# Patient Record
Sex: Female | Born: 1995 | Race: Black or African American | Hispanic: No | Marital: Single | Smoking: Never smoker
Health system: Southern US, Community
[De-identification: ages and names within clinical notes are randomized; demographics above are authoritative.]

## PROBLEM LIST (undated history)

## (undated) ENCOUNTER — Inpatient Hospital Stay (HOSPITAL_COMMUNITY): Payer: Self-pay

## (undated) DIAGNOSIS — T7840XA Allergy, unspecified, initial encounter: Secondary | ICD-10-CM

## (undated) DIAGNOSIS — L600 Ingrowing nail: Secondary | ICD-10-CM

## (undated) DIAGNOSIS — A609 Anogenital herpesviral infection, unspecified: Secondary | ICD-10-CM

## (undated) DIAGNOSIS — D649 Anemia, unspecified: Secondary | ICD-10-CM

## (undated) HISTORY — DX: Allergy, unspecified, initial encounter: T78.40XA

## (undated) HISTORY — DX: Ingrowing nail: L60.0

## (undated) HISTORY — DX: Anemia, unspecified: D64.9

---
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
## 2013-01-10 ENCOUNTER — Encounter: Payer: Self-pay | Admitting: Podiatry

## 2013-01-13 ENCOUNTER — Ambulatory Visit (INDEPENDENT_AMBULATORY_CARE_PROVIDER_SITE_OTHER): Payer: Managed Care, Other (non HMO) | Admitting: Podiatry

## 2013-01-13 ENCOUNTER — Encounter: Payer: Self-pay | Admitting: Podiatry

## 2013-01-13 VITALS — BP 105/62 | HR 63 | Ht 63.0 in | Wt 130.0 lb

## 2013-01-13 DIAGNOSIS — B351 Tinea unguium: Secondary | ICD-10-CM | POA: Insufficient documentation

## 2013-01-13 MED ORDER — CICLOPIROX 8 % EX SOLN: CUTANEOUS | Status: DC

## 2013-01-13 NOTE — Progress Notes (Signed)
Post-op visit following ingrown nail surgery Left great toe lateral border: Patient is here accompanied by her grandmother for 1 week post left hallux lateral border ingrown nail surgery. Pain is controlled without any medications. The patient denies  fever, wound drainage, increasing redness, pus, increasing pain, increasing swelling.  Stated that her mom has noted dark brown discolored right great toe. Patient and her mom want to address this problem.  Objective: Clean and dry nail border of left hallux lateral border at surgical site without surround erythema or any drainage. Right great toe has discolored brown nail at lateral 1/2. All other nails have elongated nail without changes in color or shape. Orthopedic: Rectus foot without gross deformity.  Neurologic: All epicritic and tactile sensations grossly intact.  Assessment: 1. Satisfactory post op wound healing left great toe. 2. Onychomycosis right great toe. Plan: Discussed topical treatment for the fungal infected nail. Rx. Penlac to apply affected nail.  Return in one month for follow up on nail treatment.

## 2013-01-13 NOTE — Patient Instructions (Signed)
Follow up visit after toe nail surgery on left great toe. Continue to soak left foot till pain subside. May use band aid during the day as needed. Noted right great toe has fungal infection. We will start treating it with topical medication. Apply medication(Penlac) daily and remove the top layer from the nail once a week.  Return in one month for follow up.

## 2013-02-14 ENCOUNTER — Ambulatory Visit (INDEPENDENT_AMBULATORY_CARE_PROVIDER_SITE_OTHER): Payer: Managed Care, Other (non HMO) | Admitting: Podiatry

## 2013-02-14 ENCOUNTER — Encounter: Payer: Self-pay | Admitting: Podiatry

## 2013-02-14 VITALS — Ht 63.0 in | Wt 130.0 lb

## 2013-02-14 DIAGNOSIS — B351 Tinea unguium: Secondary | ICD-10-CM

## 2013-02-14 NOTE — Progress Notes (Signed)
S: Status post nail matrixectomy left hallux, been using Penlac on right hallux x 4 weeks.  Patient was able to see improvement on right hallucal nail. O: Discolored right hallux with fungal nail.  A: Mycotic nail right hallux improving with Penlac.  P: Continue with Penlac for next 6-8 weeks.  Patient will call if she need refill on Penlac. Return as needed.

## 2013-12-29 ENCOUNTER — Ambulatory Visit (INDEPENDENT_AMBULATORY_CARE_PROVIDER_SITE_OTHER): Payer: Managed Care, Other (non HMO) | Admitting: Internal Medicine

## 2013-12-29 VITALS — BP 110/65 | HR 130 | Temp 103.0°F | Resp 18 | Ht 64.0 in | Wt 128.0 lb

## 2013-12-29 DIAGNOSIS — J020 Streptococcal pharyngitis: Secondary | ICD-10-CM

## 2013-12-29 DIAGNOSIS — R509 Fever, unspecified: Secondary | ICD-10-CM

## 2013-12-29 DIAGNOSIS — R52 Pain, unspecified: Secondary | ICD-10-CM

## 2013-12-29 DIAGNOSIS — J029 Acute pharyngitis, unspecified: Secondary | ICD-10-CM

## 2013-12-29 LAB — POCT INFLUENZA A/B
INFLUENZA A, POC: NEGATIVE
INFLUENZA B, POC: NEGATIVE

## 2013-12-29 LAB — POCT RAPID STREP A (OFFICE): Rapid Strep A Screen: POSITIVE — AB

## 2013-12-29 MED ORDER — AMOXICILLIN 875 MG PO TABS: 875.0000 mg | ORAL_TABLET | Freq: Two times a day (BID) | ORAL | Status: DC

## 2013-12-29 NOTE — Progress Notes (Signed)
This chart was scribed for Ellamae Sia, MD by Luisa Dago, ED Scribe. This patient was seen in room 14 and the patient's care was started at 9:01 PM. Subjective:    Patient ID: Tanya Pierce, female    DOB: November 10, 1995, 18 y.o.   MRN: [id number]  Chief Complaint  Patient presents with  . Fever  . Generalized Body Aches  . Sore Throat  . 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 Pt is also complaining of associated fever, generalized myalgias, and headache. She states that she went to a funeral and following it she started to experience headaches and the other associated symptoms listed above. She denies taking any OTC medication to relieve her symptoms. Denies any rhinorrhea, emesis, diarrhea, or nausea.   Pt states that she has never been diagnosed with Mono.   Patient Active Problem List   Diagnosis Date Noted  . Onychomycosis due to dermatophyte 01/13/2013   Past Medical History  Diagnosis Date  . Ingrown toenail   . Allergy   . Anemia    Past Surgical History  Procedure Laterality Date  . Eye surgery  2002    eye muscle  . Myringotomy  1998  . Adenoidectomy  1998   No Known Allergies Prior to Admission medications   Medication Sig Start Date End Date Taking? Authorizing Provider  drospirenone-ethinyl estradiol (YAZ,GIANVI,LORYNA) 3-0.02 MG tablet Take 1 tablet by mouth daily.    Historical Provider, MD   History   Social History  . Marital Status: Single    Spouse Name: N/A    Number of Children: N/A  . Years of Education: N/A   Occupational History  . Not on file.   Social History Main Topics  . Smoking status: Never Smoker   . Smokeless tobacco: Never Used  . Alcohol Use: No  . Drug Use: No  . Sexual Activity: No   Other Topics Concern  . Not on file   Social History Narrative  . No narrative on  file     Review of Systems As per HPI and PMH    Objective:   Physical Exam  Nursing note and vitals reviewed. Constitutional: She is oriented to person, place, and time. She appears well-developed and well-nourished. No distress.  HENT:  Head: Normocephalic and atraumatic.  Right Ear: External ear normal.  Left Ear: External ear normal.  Nose: Nose normal.  Mouth/Throat: Oropharyngeal exudate present.  Eyes: Conjunctivae and EOM are normal. Pupils are equal, round, and reactive to light. Right eye exhibits no discharge. Left eye exhibits no discharge.  Neck: Normal range of motion. Neck supple. No thyromegaly present.  Cardiovascular: Normal rate, regular rhythm, normal heart sounds and intact distal pulses.  Exam reveals no gallop and no friction rub.   No murmur heard. Pulmonary/Chest: Effort normal and breath sounds normal. No respiratory distress.  Abdominal: Soft. Bowel sounds are normal. She exhibits no distension and no mass. There is no tenderness. There is no rebound.  Musculoskeletal: She exhibits no edema and no tenderness.  Lymphadenopathy:       Head (right side): Tonsillar adenopathy present.       Head (left side): Tonsillar adenopathy present.  Neurological: She is alert and oriented to person, place, and time. No cranial nerve deficit.  Skin: Skin is warm and dry. No rash noted.  Psychiatric: She has a normal mood and affect. Her behavior is normal. Thought content normal.      Filed Vitals:   12/29/13 2055  BP: 110/65  Pulse: 130  Temp: 103 F (39.4 C)  TempSrc: Oral  Resp: 18  Height: 5\' 4"  (1.626 m)  Weight: 128 lb (58.06 kg)  SpO2: 98%   Results for orders placed in visit on 12/29/13  POCT INFLUENZA A/B      Result Value Ref Range   Influenza A, POC Negative     Influenza B, POC Negative    POCT RAPID STREP A (OFFICE)      Result Value Ref Range   Rapid Strep A Screen Positive (*) Negative       Assessment & Plan:  Fever, unspecified -  Plan: POCT Influenza A/B  Body aches - Plan: POCT Influenza A/B  Acute pharyngitis - Plan: POCT rapid strep A  Meds ordered this encounter  Medications  . amoxicillin (AMOXIL) 875 MG tablet    Sig: Take 1 tablet (875 mg total) by mouth 2 (two) times daily.    Dispense:  20 tablet    Refill:  0  GM [id number]    I have completed the patient encounter in its entirety as documented by the scribe, with editing by me where necessary. Robert P. Merla Richesoolittle, M.D.

## 2016-04-08 ENCOUNTER — Encounter (HOSPITAL_COMMUNITY): Payer: Self-pay | Admitting: *Deleted

## 2016-04-08 DIAGNOSIS — O26891 Other specified pregnancy related conditions, first trimester: Secondary | ICD-10-CM | POA: Diagnosis present

## 2016-04-08 DIAGNOSIS — Z3A12 12 weeks gestation of pregnancy: Secondary | ICD-10-CM | POA: Diagnosis not present

## 2016-04-08 DIAGNOSIS — R1084 Generalized abdominal pain: Secondary | ICD-10-CM | POA: Diagnosis not present

## 2016-04-08 LAB — CBC
HEMATOCRIT: 34.5 % — AB (ref 36.0–46.0)
HEMOGLOBIN: 11.4 g/dL — AB (ref 12.0–15.0)
MCH: 28.9 pg (ref 26.0–34.0)
MCHC: 33 g/dL (ref 30.0–36.0)
MCV: 87.3 fL (ref 78.0–100.0)
Platelets: 243 10*3/uL (ref 150–400)
RBC: 3.95 MIL/uL (ref 3.87–5.11)
RDW: 14.6 % (ref 11.5–15.5)
WBC: 13.4 10*3/uL — AB (ref 4.0–10.5)

## 2016-04-08 LAB — URINE MICROSCOPIC-ADD ON

## 2016-04-08 LAB — URINALYSIS, ROUTINE W REFLEX MICROSCOPIC
Bilirubin Urine: NEGATIVE
GLUCOSE, UA: NEGATIVE mg/dL
Hgb urine dipstick: NEGATIVE
KETONES UR: NEGATIVE mg/dL
Nitrite: NEGATIVE
PH: 6 (ref 5.0–8.0)
PROTEIN: NEGATIVE mg/dL
Specific Gravity, Urine: 1.015 (ref 1.005–1.030)

## 2016-04-08 LAB — I-STAT BETA HCG BLOOD, ED (MC, WL, AP ONLY)

## 2016-04-08 NOTE — ED Notes (Signed)
THE PT IS C/O abd pain after having a constipated stool   Just pta  She was dizzy while having constipated stool   lmp April  No dizziness now

## 2016-04-09 ENCOUNTER — Emergency Department (HOSPITAL_COMMUNITY)
Admission: EM | Admit: 2016-04-09 | Discharge: 2016-04-09 | Disposition: A | Discharge status: Home / Self Care | Payer: BLUE CROSS/BLUE SHIELD | Attending: Emergency Medicine | Admitting: Emergency Medicine

## 2016-04-09 DIAGNOSIS — Z3490 Encounter for supervision of normal pregnancy, unspecified, unspecified trimester: Secondary | ICD-10-CM

## 2016-04-09 LAB — COMPREHENSIVE METABOLIC PANEL
ALK PHOS: 50 U/L (ref 38–126)
ALT: 10 U/L — AB (ref 14–54)
ANION GAP: 9 (ref 5–15)
AST: 24 U/L (ref 15–41)
Albumin: 3.6 g/dL (ref 3.5–5.0)
BILIRUBIN TOTAL: 0.3 mg/dL (ref 0.3–1.2)
BUN: 9 mg/dL (ref 6–20)
CALCIUM: 9.4 mg/dL (ref 8.9–10.3)
CO2: 23 mmol/L (ref 22–32)
CREATININE: 0.64 mg/dL (ref 0.44–1.00)
Chloride: 102 mmol/L (ref 101–111)
GFR calc non Af Amer: >60 mL/min (ref >60)
GLUCOSE: 100 mg/dL — AB (ref 65–99)
Potassium: 3.8 mmol/L (ref 3.5–5.1)
SODIUM: 134 mmol/L — AB (ref 135–145)
TOTAL PROTEIN: 6.8 g/dL (ref 6.5–8.1)

## 2016-04-09 LAB — LIPASE, BLOOD: LIPASE: 33 U/L (ref 11–51)

## 2016-04-09 NOTE — ED Provider Notes (Signed)
CSN: [id number]     Arrival date & time 04/08/16  2248 History  By signing my name below, I, Tanya Pierce, attest that this documentation has been prepared under the direction and in the presence of Melene Plan, DO. Electronically Signed: Ronney Pierce, ED Scribe. 04/09/2016. 1:57 AM.    Chief Complaint  Patient presents with  . Abdominal Pain   Patient is a 20 y.o. female presenting with abdominal pain. The history is provided by the patient. No language interpreter was used.  Abdominal Pain Pain location:  Generalized Pain quality: squeezing   Pain radiates to:  Does not radiate Pain severity:  Severe (8/10) Duration:  6 hours Timing:  Intermittent Chronicity:  New Relieved by:  None tried Worsened by:  Nothing tried Ineffective treatments:  None tried Associated symptoms: diarrhea   Associated symptoms: no chills, no dysuria, no fever, no vaginal bleeding, no vaginal discharge and no vomiting     HPI Comments: Tanya Pierce is a 20 y.o. female who presents to the Emergency Department complaining of intermittent, tight, squeezing, 8/10, generalized abdominal pain that began about 6 hours ago. She reports associated diaphoresis, light-headed dizziness, and feelings of near-syncope with the pain, as well as diarrhea. Patient states she came to the ED tonight as she has never experienced these symptoms before. She reports she has a scheduled OB visit in the upcoming week. Patient states her LMP occurred 02/06/16. She denies a history of any prior pregnancies. She denies vaginal bleeding, vaginal discharge, vomiting, fever, chills, or dysuria.   Past Medical History  Diagnosis Date  . Ingrown toenail   . Allergy   . Anemia    Past Surgical History  Procedure Laterality Date  . Eye surgery  2002    eye muscle  . Myringotomy  1998  . Adenoidectomy  1998   Family History  Problem Relation Age of Onset  . Hyperlipidemia Mother    Social History  Substance Use Topics  . Smoking status:  Never Smoker   . Smokeless tobacco: Never Used  . Alcohol Use: No   OB History    No data available     Review of Systems  Constitutional: Positive for diaphoresis (accompanying pain). Negative for fever and chills.  Gastrointestinal: Positive for abdominal pain and diarrhea. Negative for vomiting.  Genitourinary: Negative for dysuria, vaginal bleeding and vaginal discharge.  Neurological: Positive for light-headedness (accompanying pain).  All other systems reviewed and are negative.     Allergies  Review of patient's allergies indicates no known allergies.  Home Medications   Prior to Admission medications   Medication Sig Start Date End Date Taking? Authorizing Provider  amoxicillin (AMOXIL) 875 MG tablet Take 1 tablet (875 mg total) by mouth 2 (two) times daily. 12/29/13   Tonye Pearson, MD  drospirenone-ethinyl estradiol Pierre Bali) 3-0.02 MG tablet Take 1 tablet by mouth daily.    Historical Provider, MD   BP 102/61 mmHg  Pulse 71  Temp(Src) 97.5 F (36.4 C) (Oral)  Resp 16  SpO2 100% Physical Exam  Constitutional: She is oriented to person, place, and time. She appears well-developed and well-nourished. No distress.  HENT:  Head: Normocephalic and atraumatic.  Eyes: Conjunctivae and EOM are normal.  Neck: Neck supple. No tracheal deviation present.  Cardiovascular: Normal rate.   Pulmonary/Chest: Effort normal. No respiratory distress.  Abdominal: There is tenderness.  Mild suprapubic tenderness.   Musculoskeletal: Normal range of motion.  Neurological: She is alert and oriented to person, place, and time.  Skin: Skin is warm and dry.  Psychiatric: She has a normal mood and affect. Her behavior is normal.  Nursing note and vitals reviewed.   ED Course  Procedures (including critical care time)  DIAGNOSTIC STUDIES: Oxygen Saturation is 100% on RA, normal by my interpretation.    COORDINATION OF CARE: 1:49 AM - Discussed treatment plan with pt  at bedside. Discussed with pt that dehydration may be contributing to her symptoms. Pt verbalized understanding and agreed to plan.   Labs Review Labs Reviewed  COMPREHENSIVE METABOLIC PANEL - Abnormal; Notable for the following:    Sodium 134 (*)    Glucose, Bld 100 (*)    ALT 10 (*)    All other components within normal limits  CBC - Abnormal; Notable for the following:    WBC 13.4 (*)    Hemoglobin 11.4 (*)    HCT 34.5 (*)    All other components within normal limits  URINALYSIS, ROUTINE W REFLEX MICROSCOPIC (NOT AT [facility]) - Abnormal; Notable for the following:    APPearance CLOUDY (*)    Leukocytes, UA SMALL (*)    All other components within normal limits  URINE MICROSCOPIC-ADD ON - Abnormal; Notable for the following:    Squamous Epithelial / LPF 6-30 (*)    Bacteria, UA MANY (*)    All other components within normal limits  I-STAT BETA HCG BLOOD, ED ([facility], [facility], [facility] ONLY) - Abnormal; Notable for the following:    I-stat hCG, quantitative >2000.0 (*)    All other components within normal limits  LIPASE, BLOOD   EMERGENCY DEPARTMENT [place] PREGNANCY "Study: Limited Ultrasound of the Pelvis"  INDICATIONS:Pregnancy(required) Multiple views of the uterus and pelvic cavity are obtained with a multi-frequency probe.  APPROACH:Transabdominal   PERFORMED BY: Myself  IMAGES ARCHIVED?: Yes  LIMITATIONS: N/A  PREGNANCY FREE FLUID: None  PREGNANCY UTERUS FINDINGS:Uterus enlarged and Gestational sac noted ADNEXAL FINDINGS: N/A  PREGNANCY FINDINGS: Intrauterine gestational sac noted, Yolk sac noted, Fetal pole present and Fetal heart activity seen  INTERPRETATION: Viable intrauterine pregnancy  GESTATIONAL AGE, ESTIMATE: 12 weeks 5 days  FETAL HEART RATE: 158  COMMENT(Estimate of Gestational Age):  12wk 5 days  I have personally reviewed and evaluated these images and lab results as part of my medical decision-making.   MDM   Final diagnoses:  None   20 yo F With a chief  complaint of crampy abdominal pain. Patient was found to be pregnant. IUP noted on outside ultrasound. Estimated 12 weeks 5 days. Patient has follow-up already scheduled with OB on Friday.    1:59 AM:  I have discussed the diagnosis/risks/treatment options with the patient and family and believe the pt to be eligible for discharge home to follow-up with OB. We also discussed returning to the ED immediately if new or worsening sx occur. We discussed the sx which are most concerning (e.g., sudden worsening pain, vaginal bleeding, vaginal discharge) that necessitate immediate return. Medications administered to the patient during their visit and any new prescriptions provided to the patient are listed below.  Medications given during this visit Medications - No data to display  New Prescriptions   No medications on file    The patient appears reasonably screen and/or stabilized for discharge and I doubt any other medical condition or other [facility] requiring further screening, evaluation, or treatment in the ED at this time prior to discharge.    I personally performed the services described in this documentation, which was scribed in my presence.  The recorded information has been reviewed and is accurate.       Melene Planan Henrik Orihuela, DO 04/09/16 [id number]

## 2016-04-09 NOTE — Discharge Instructions (Signed)
First Trimester of Pregnancy The first trimester of pregnancy is from week 1 until the end of week 12 (months 1 through 3). A week after a sperm fertilizes an egg, the egg will implant on the wall of the uterus. This embryo will begin to develop into a baby. Genes from you and your partner are forming the baby. The female genes determine whether the baby is a boy or a girl. At 6-8 weeks, the eyes and face are formed, and the heartbeat can be seen on ultrasound. At the end of 12 weeks, all the baby's organs are formed.  Now that you are pregnant, you will want to do everything you can to have a healthy baby. Two of the most important things are to get good prenatal care and to follow your health care provider's instructions. Prenatal care is all the medical care you receive before the baby's birth. This care will help prevent, find, and treat any problems during the pregnancy and childbirth. BODY CHANGES Your body goes through many changes during pregnancy. The changes vary from woman to woman.   You may gain or lose a couple of pounds at first.  You may feel sick to your stomach (nauseous) and throw up (vomit). If the vomiting is uncontrollable, call your health care provider.  You may tire easily.  You may develop headaches that can be relieved by medicines approved by your health care provider.  You may urinate more often. Painful urination may mean you have a bladder infection.  You may develop heartburn as a result of your pregnancy.  You may develop constipation because certain hormones are causing the muscles that push waste through your intestines to slow down.  You may develop hemorrhoids or swollen, bulging veins (varicose veins).  Your breasts may begin to grow larger and become tender. Your nipples may stick out more, and the tissue that surrounds them (areola) may become darker.  Your gums may bleed and may be sensitive to brushing and flossing.  Dark spots or blotches (chloasma,  mask of pregnancy) may develop on your face. This will likely fade after the baby is born.  Your menstrual periods will stop.  You may have a loss of appetite.  You may develop cravings for certain kinds of food.  You may have changes in your emotions from day to day, such as being excited to be pregnant or being concerned that something may go wrong with the pregnancy and baby.  You may have more vivid and strange dreams.  You may have changes in your hair. These can include thickening of your hair, rapid growth, and changes in texture. Some women also have hair loss during or after pregnancy, or hair that feels dry or thin. Your hair will most likely return to normal after your baby is born. WHAT TO EXPECT AT YOUR PRENATAL VISITS During a routine prenatal visit:  You will be weighed to make sure you and the baby are growing normally.  Your blood pressure will be taken.  Your abdomen will be measured to track your baby's growth.  The fetal heartbeat will be listened to starting around week 10 or 12 of your pregnancy.  Test results from any previous visits will be discussed. Your health care provider may ask you:  How you are feeling.  If you are feeling the baby move.  If you have had any abnormal symptoms, such as leaking fluid, bleeding, severe headaches, or abdominal cramping.  If you are using any tobacco products,   including cigarettes, chewing tobacco, and electronic cigarettes.  If you have any questions. Other tests that may be performed during your first trimester include:  Blood tests to find your blood type and to check for the presence of any previous infections. They will also be used to check for low iron levels (anemia) and Rh antibodies. Later in the pregnancy, blood tests for diabetes will be done along with other tests if problems develop.  Urine tests to check for infections, diabetes, or protein in the urine.  An ultrasound to confirm the proper growth  and development of the baby.  An amniocentesis to check for possible genetic problems.  Fetal screens for spina bifida and Down syndrome.  You may need other tests to make sure you and the baby are doing well.  HIV (human immunodeficiency virus) testing. Routine prenatal testing includes screening for HIV, unless you choose not to have this test. HOME CARE INSTRUCTIONS  Medicines  Follow your health care provider's instructions regarding medicine use. Specific medicines may be either safe or unsafe to take during pregnancy.  Take your prenatal vitamins as directed.  If you develop constipation, try taking a stool softener if your health care provider approves. Diet  Eat regular, well-balanced meals. Choose a variety of foods, such as meat or vegetable-based protein, fish, milk and low-fat dairy products, vegetables, fruits, and whole grain breads and cereals. Your health care provider will help you determine the amount of weight gain that is right for you.  Avoid raw meat and uncooked cheese. These carry germs that can cause birth defects in the baby.  Eating four or five small meals rather than three large meals a day may help relieve nausea and vomiting. If you start to feel nauseous, eating a few soda crackers can be helpful. Drinking liquids between meals instead of during meals also seems to help nausea and vomiting.  If you develop constipation, eat more high-fiber foods, such as fresh vegetables or fruit and whole grains. Drink enough fluids to keep your urine clear or pale yellow. Activity and Exercise  Exercise only as directed by your health care provider. Exercising will help you:  Control your weight.  Stay in shape.  Be prepared for labor and delivery.  Experiencing pain or cramping in the lower abdomen or low back is a good sign that you should stop exercising. Check with your health care provider before continuing normal exercises.  Try to avoid standing for long  periods of time. Move your legs often if you must stand in one place for a long time.  Avoid heavy lifting.  Wear low-heeled shoes, and practice good posture.  You may continue to have sex unless your health care provider directs you otherwise. Relief of Pain or Discomfort  Wear a good support bra for breast tenderness.   Take warm sitz baths to soothe any pain or discomfort caused by hemorrhoids. Use hemorrhoid cream if your health care provider approves.   Rest with your legs elevated if you have leg cramps or low back pain.  If you develop varicose veins in your legs, wear support hose. Elevate your feet for 15 minutes, 3-4 times a day. Limit salt in your diet. Prenatal Care  Schedule your prenatal visits by the twelfth week of pregnancy. They are usually scheduled monthly at first, then more often in the last 2 months before delivery.  Write down your questions. Take them to your prenatal visits.  Keep all your prenatal visits as directed by your   health care provider. Safety  Wear your seat belt at all times when driving.  Make a list of emergency phone numbers, including numbers for family, friends, the hospital, and police and fire departments. General Tips  Ask your health care provider for a referral to a local prenatal education class. Begin classes no later than at the beginning of month 6 of your pregnancy.  Ask for help if you have counseling or nutritional needs during pregnancy. Your health care provider can offer advice or refer you to specialists for help with various needs.  Do not use hot tubs, steam rooms, or saunas.  Do not douche or use tampons or scented sanitary pads.  Do not cross your legs for long periods of time.  Avoid cat litter boxes and soil used by cats. These carry germs that can cause birth defects in the baby and possibly loss of the fetus by miscarriage or stillbirth.  Avoid all smoking, herbs, alcohol, and medicines not prescribed by  your health care provider. Chemicals in these affect the formation and growth of the baby.  Do not use any tobacco products, including cigarettes, chewing tobacco, and electronic cigarettes. If you need help quitting, ask your health care provider. You may receive counseling support and other resources to help you quit.  Schedule a dentist appointment. At home, brush your teeth with a soft toothbrush and be gentle when you floss. SEEK MEDICAL CARE IF:   You have dizziness.  You have mild pelvic cramps, pelvic pressure, or nagging pain in the abdominal area.  You have persistent nausea, vomiting, or diarrhea.  You have a bad smelling vaginal discharge.  You have pain with urination.  You notice increased swelling in your face, hands, legs, or ankles. SEEK IMMEDIATE MEDICAL CARE IF:   You have a fever.  You are leaking fluid from your vagina.  You have spotting or bleeding from your vagina.  You have severe abdominal cramping or pain.  You have rapid weight gain or loss.  You vomit blood or material that looks like coffee grounds.  You are exposed to German measles and have never had them.  You are exposed to fifth disease or chickenpox.  You develop a severe headache.  You have shortness of breath.  You have any kind of trauma, such as from a fall or a car accident.   This information is not intended to replace advice given to you by your health care provider. Make sure you discuss any questions you have with your health care provider.   Document Released: 10/03/2001 Document Revised: 10/30/2014 Document Reviewed: 08/19/2013 Elsevier Interactive Patient Education 2016 [employer] Inc[employer]  

## 2016-08-29 ENCOUNTER — Inpatient Hospital Stay (HOSPITAL_COMMUNITY)
Admission: AD | Admit: 2016-08-29 | Discharge: 2016-08-29 | Disposition: A | Discharge status: Home / Self Care | Payer: BLUE CROSS/BLUE SHIELD | Source: Ambulatory Visit | Attending: Obstetrics and Gynecology | Admitting: Obstetrics and Gynecology

## 2016-08-29 ENCOUNTER — Encounter (HOSPITAL_COMMUNITY): Payer: Self-pay | Admitting: *Deleted

## 2016-08-29 ENCOUNTER — Inpatient Hospital Stay (HOSPITAL_COMMUNITY): Payer: BLUE CROSS/BLUE SHIELD

## 2016-08-29 DIAGNOSIS — O4693 Antepartum hemorrhage, unspecified, third trimester: Secondary | ICD-10-CM | POA: Insufficient documentation

## 2016-08-29 DIAGNOSIS — Z3A33 33 weeks gestation of pregnancy: Secondary | ICD-10-CM | POA: Diagnosis not present

## 2016-08-29 DIAGNOSIS — O4690 Antepartum hemorrhage, unspecified, unspecified trimester: Secondary | ICD-10-CM

## 2016-08-29 LAB — TYPE AND SCREEN
ABO/RH(D): O POS
Antibody Screen: NEGATIVE

## 2016-08-29 LAB — CBC
HCT: 35.9 % — ABNORMAL LOW (ref 36.0–46.0)
Hemoglobin: 12.5 g/dL (ref 12.0–15.0)
MCH: 30.5 pg (ref 26.0–34.0)
MCHC: 34.8 g/dL (ref 30.0–36.0)
MCV: 87.6 fL (ref 78.0–100.0)
PLATELETS: 208 10*3/uL (ref 150–400)
RBC: 4.1 MIL/uL (ref 3.87–5.11)
RDW: 14.8 % (ref 11.5–15.5)
WBC: 14.7 10*3/uL — AB (ref 4.0–10.5)

## 2016-08-29 LAB — ABO/RH: ABO/RH(D): O POS

## 2016-08-29 NOTE — MAU Note (Signed)
About 1030, used the bathroom and noticed she had started bleeding. Like a period, bright red and heavy.  Called dr.  Had intercourse last night. Went to office, was still bleeding.  Now is feeling pelvic pressure

## 2016-08-29 NOTE — MAU Note (Signed)
Urine in lab 

## 2016-08-29 NOTE — MAU Provider Note (Signed)
History     CSN: [id number]  Arrival date and time: 08/29/16 1353   None     Chief Complaint  Patient presents with  . Vaginal Bleeding   G1 @33 .5 weeks here with VB. She reports bright red vaginal bleeding as much as menstrual cycle since 10 this am. She has changed 2 pads but they were not saturated. She denies cramping, abdominal pain, and ctx but reports pelvic pressure. She reports good FM. She reports IC last night. She denies falls or trauma. She was seen in the office today and sent for further evaluation.     OB History    Gravida Para Term Preterm AB Living   1             SAB TAB Ectopic Multiple Live Births                  Past Medical History:  Diagnosis Date  . Allergy   . Anemia   . Ingrown toenail     Past Surgical History:  Procedure Laterality Date  . ADENOIDECTOMY  1998  . EYE SURGERY  2002   eye muscle  . MYRINGOTOMY  1998    Family History  Problem Relation Age of Onset  . Hyperlipidemia Mother   . Hypertension Father   . Hyperlipidemia Maternal Grandmother   . Cancer Maternal Grandfather     throat and prostate  . Diabetes Paternal Grandfather   . Heart disease Paternal Grandfather     Social History  Substance Use Topics  . Smoking status: Never Smoker  . Smokeless tobacco: Never Used  . Alcohol use No     Comment: none with preg    Allergies: No Known Allergies  Prescriptions Prior to Admission  Medication Sig Dispense Refill Last Dose  . amoxicillin (AMOXIL) 875 MG tablet Take 1 tablet (875 mg total) by mouth 2 (two) times daily. 20 tablet 0   . drospirenone-ethinyl estradiol (YAZ,GIANVI,LORYNA) 3-0.02 MG tablet Take 1 tablet by mouth daily.   Taking    Review of Systems  Constitutional: Negative.   Gastrointestinal: Negative.    Physical Exam   Blood pressure 106/65, pulse 92, temperature 98.4 F (36.9 C), temperature source Oral, resp. rate 16, weight 63.8 kg (140 lb 9.6 oz).  Physical Exam  Constitutional: She  is oriented to person, place, and time. She appears well-developed and well-nourished. No distress.  HENT:  Head: Normocephalic and atraumatic.  Neck: Normal range of motion. Neck supple.  Cardiovascular: Normal rate.   Respiratory: Effort normal.  GI: Soft. She exhibits no distension. There is no tenderness.  gravid  Genitourinary:  Genitourinary Comments: External: nno lesions Vagina: rugated, nulli, scant brown discharge SVE: closed thick   Musculoskeletal: Normal range of motion.  Neurological: She is alert and oriented to person, place, and time.  Skin: Skin is warm and dry.  Psychiatric: She has a normal mood and affect.   EFM: 145 bpm, mod variability, + accels, no decels Toco: irritability  MAU Course  Procedures  MDM Labs and US ordered. 1600-report and transfer of care given to AtticaPoe, CNM. Donette LarryMelanie Bhambri, CNM  08/29/2016 4:13 PM   Pt denies abdominal pain and has scant brown vaginal discharge Prelim US: Posterior placenta; no abruption or previa noted; AFI 13.75no abnormality noted EFM; baseline 135-140, moderate variability, reactive, no decelerations Toco: no UCs  C/W Dr. Estanislado Pandyivard> D/C home with reassurance and pelvic precautions until next scheduled PNV Assessment and Plan  G1 at 3895w5d  Vaginal bleeding in pregnancy, third trimester - Plan: Discharge patient  Vaginal bleeding in pregnancy - Plan: US MFM OB LIMITED, US MFM OB LIMITED Reassuring fetal parameters   Medication List    TAKE these medications   prenatal multivitamin Tabs tablet Take 1 tablet by mouth daily at 12 noon.      Follow-up Information    Purcell NailsOBERTS,ANGELA Y, MD Follow up.   Specialty:  Obstetrics and Gynecology Why:  Keep your scheduled PN app[ointment Contact information: [address] [place] [address] [phone]

## 2016-08-29 NOTE — Discharge Instructions (Signed)
Vaginal Bleeding During Pregnancy, Third Trimester ° A small amount of bleeding (spotting) from the vagina is common in pregnancy. Sometimes the bleeding is normal and is not a problem, and sometimes it is a sign of something serious. Be sure to tell your doctor about any bleeding from your vagina right away. °HOME CARE °· Watch your condition for any changes. °· Follow your doctor's instructions about how active you can be. °· If you are on bed rest: °¨ You may need to stay in bed and only get up to use the bathroom. °¨ You may be allowed to do some activities. °¨ If you need help, make plans for someone to help you. °· Write down: °¨ The number of pads you use each day. °¨ How often you change pads. °¨ How soaked (saturated) your pads are. °· Do not use tampons. °· Do not douche. °· Do not have sex or orgasms until your doctor says it is okay. °· Follow your doctor's advice about lifting, driving, and doing physical activities. °· If you pass any tissue from your vagina, save the tissue so you can show it to your doctor. °· Only take medicines as told by your doctor. °· Do not take aspirin because it can make you bleed. °· Keep all follow-up visits as told by your doctor. °GET HELP IF:  °· You bleed from your vagina. °· You have cramps. °· You have labor pains. °· You have a fever that does not go away after you take medicine. °GET HELP RIGHT AWAY IF: °· You have very bad cramps in your back or belly (abdomen). °· You have chills. °· You have a gush of fluid from your vagina. °· You pass large clots or tissue from your vagina. °· You bleed more. °· You feel light-headed or weak. °· You pass out (faint). °· You do not feel your baby move around as much as before. °MAKE SURE YOU: °· Understand these instructions. °· Will watch your condition. °· Will get help right away if you are not doing well or get worse. °  °This information is not intended to replace advice given to you by your health care provider. Make sure  you discuss any questions you have with your health care provider. °  °Document Released: 02/23/2014 Document Reviewed: 02/23/2014 °Elsevier Interactive Patient Education ©2016 Elsevier Inc. ° °

## 2016-09-30 ENCOUNTER — Inpatient Hospital Stay (HOSPITAL_COMMUNITY)
Admission: AD | Admit: 2016-09-30 | Discharge: 2016-09-30 | Disposition: A | Discharge status: Home / Self Care | Payer: BLUE CROSS/BLUE SHIELD | Source: Ambulatory Visit | Attending: Obstetrics and Gynecology | Admitting: Obstetrics and Gynecology

## 2016-09-30 ENCOUNTER — Encounter (HOSPITAL_COMMUNITY): Payer: Self-pay | Admitting: *Deleted

## 2016-09-30 DIAGNOSIS — A6000 Herpesviral infection of urogenital system, unspecified: Secondary | ICD-10-CM | POA: Diagnosis not present

## 2016-09-30 DIAGNOSIS — O471 False labor at or after 37 completed weeks of gestation: Secondary | ICD-10-CM | POA: Insufficient documentation

## 2016-09-30 DIAGNOSIS — O98313 Other infections with a predominantly sexual mode of transmission complicating pregnancy, third trimester: Secondary | ICD-10-CM | POA: Diagnosis not present

## 2016-09-30 DIAGNOSIS — Z3A38 38 weeks gestation of pregnancy: Secondary | ICD-10-CM | POA: Insufficient documentation

## 2016-09-30 DIAGNOSIS — Z3493 Encounter for supervision of normal pregnancy, unspecified, third trimester: Secondary | ICD-10-CM | POA: Diagnosis present

## 2016-09-30 NOTE — MAU Note (Signed)
Pt reports uc's since 0000

## 2016-09-30 NOTE — MAU Provider Note (Signed)
History    Tanya Pierce is a 20y.o. G1P0 at 38.2wks who presents, unannounced, for contractions.  Patient reports contractions started at midnight.  Patient reports good fetal movement and denies LoF and VB. Patient reports desire for epidural for pain mgmt. Patient reports that she has not had a lot to eat or drink today.  Patient Active Problem List   Diagnosis Date Noted  . Onychomycosis due to dermatophyte 01/13/2013    Chief Complaint  Patient presents with  . Contractions   HPI  OB History    Gravida Para Term Preterm AB Living   1             SAB TAB Ectopic Multiple Live Births                  Past Medical History:  Diagnosis Date  . Allergy   . Anemia   . Ingrown toenail     Past Surgical History:  Procedure Laterality Date  . ADENOIDECTOMY  1998  . EYE SURGERY  2002   eye muscle  . MYRINGOTOMY  1998    Family History  Problem Relation Age of Onset  . Hyperlipidemia Mother   . Hypertension Father   . Hyperlipidemia Maternal Grandmother   . Cancer Maternal Grandfather     throat and prostate  . Diabetes Paternal Grandfather   . Heart disease Paternal Grandfather     Social History  Substance Use Topics  . Smoking status: Never Smoker  . Smokeless tobacco: Never Used  . Alcohol use No     Comment: none with preg    Allergies:  Allergies  Allergen Reactions  . Monistat [Miconazole] Swelling and Rash    No prescriptions prior to admission.    ROS  See HPI Above Physical Exam   Blood pressure (!) 103/52, pulse 70, temperature 99.8 F (37.7 C), resp. rate 18, height 5\' 4"  (1.626 m), weight 66.3 kg (146 lb 3.2 oz).  No results found for this or any previous visit (from the past 24 hour(s)).  Physical Exam  Constitutional: She is oriented to person, place, and time. She appears well-developed and well-nourished. No distress.  HENT:  Head: Normocephalic and atraumatic.  Eyes: Conjunctivae are normal.  Neck: Normal range of motion.   Cardiovascular: Normal rate.   Respiratory: Effort normal.  GI: Soft.  Musculoskeletal: Normal range of motion.  Neurological: She is alert and oriented to person, place, and time.  Skin: Skin is warm and dry.   VE: 1-2/80/-3, firm by Clarisse GougeBridget, RN  FHR: 135 bpm, Mod Var, -Decels, +Accels UC: Q2-213min, palpates mild ED Course  Assessment: IUP at 38.2wks Cat I FT Contractions +HSV 2 Culture  Plan: -In room to discuss recent positive HSV 2 culture results -Patient appropriately upset and with many q/c -Reassurances given and q/c addressed -Informed of recommendation for primary c/s in lieu of vaginal delivery with respect to HSV diagnosis -Patient requests information remains confidential and not discussed in front of family -Oral hydration -Will monitor for 2 hours and recheck  Follow Up ([phone]) -Nurse instructed to perform vaginal exam; reports no change -Patient reports improvement in symptoms with oral hydration -Given option to receive IGM blood test for HSV-Accepts and test ordered for Igm hsv I and II stat -Will await results and if negative will consult with MD on call as appropriate -Keep appt as scheduled: 12/12 -Encouraged to call if any questions or concerns arise prior to next scheduled office visit.  -Discharged to home  in stable condition  Cherre RobinsJessica L Jase Reep CNM, MSN 09/30/2016 5:39 AM

## 2016-09-30 NOTE — Discharge Instructions (Signed)
Braxton Hicks Contractions °Contractions of the uterus can occur throughout pregnancy. Contractions are not always a sign that you are in labor.  °WHAT ARE BRAXTON HICKS CONTRACTIONS?  °Contractions that occur before labor are called Braxton Hicks contractions, or false labor. Toward the end of pregnancy (32-34 weeks), these contractions can develop more often and may become more forceful. This is not true labor because these contractions do not result in opening (dilatation) and thinning of the cervix. They are sometimes difficult to tell apart from true labor because these contractions can be forceful and people have different pain tolerances. You should not feel embarrassed if you go to the hospital with false labor. Sometimes, the only way to tell if you are in true labor is for your health care provider to look for changes in the cervix. °If there are no prenatal problems or other health problems associated with the pregnancy, it is completely safe to be sent home with false labor and await the onset of true labor. °HOW CAN YOU TELL THE DIFFERENCE BETWEEN TRUE AND FALSE LABOR? °False Labor  °· The contractions of false labor are usually shorter and not as hard as those of true labor.   °· The contractions are usually irregular.   °· The contractions are often felt in the front of the lower abdomen and in the groin.   °· The contractions may go away when you walk around or change positions while lying down.   °· The contractions get weaker and are shorter lasting as time goes on.   °· The contractions do not usually become progressively stronger, regular, and closer together as with true labor.   °True Labor  °· Contractions in true labor last 30-70 seconds, become very regular, usually become more intense, and increase in frequency.   °· The contractions do not go away with walking.   °· The discomfort is usually felt in the top of the uterus and spreads to the lower abdomen and low back.   °· True labor can be  determined by your health care provider with an exam. This will show that the cervix is dilating and getting thinner.   °WHAT TO REMEMBER °· Keep up with your usual exercises and follow other instructions given by your health care provider.   °· Take medicines as directed by your health care provider.   °· Keep your regular prenatal appointments.   °· Eat and drink lightly if you think you are going into labor.   °· If Braxton Hicks contractions are making you uncomfortable:   °¨ Change your position from lying down or resting to walking, or from walking to resting.   °¨ Sit and rest in a tub of warm water.   °¨ Drink 2-3 glasses of water. Dehydration may cause these contractions.   °¨ Do slow and deep breathing several times an hour.   °WHEN SHOULD I SEEK IMMEDIATE MEDICAL CARE? °Seek immediate medical care if: °· Your contractions become stronger, more regular, and closer together.   °· You have fluid leaking or gushing from your vagina.   °· You have a fever.     °· You have vaginal bleeding.   °· You have continuous abdominal pain.   °· You have low back pain that you never had before.   °· You feel your baby's head pushing down and causing pelvic pressure.   °· Your baby is not moving as much as it used to.   °This information is not intended to replace advice given to you by your health care provider. Make sure you discuss any questions you have with your health care provider. °Document Released: 10/09/2005 Document   Revised: 01/31/2016 Document Reviewed: 07/21/2013 °Elsevier Interactive Patient Education © 2017 Elsevier Inc. ° °

## 2016-09-30 NOTE — MAU Note (Signed)
Urine sent to lab 

## 2016-10-02 LAB — HSV(HERPES SMPLX)ABS-I+II(IGG+IGM)-BLD: HSV 1 GLYCOPROTEIN G AB, IGG: 3.84 {index} — AB (ref 0.00–0.90)

## 2016-10-05 ENCOUNTER — Ambulatory Visit (HOSPITAL_COMMUNITY)
Admission: RE | Admit: 2016-10-05 | Discharge: 2016-10-05 | Disposition: A | Discharge status: Home / Self Care | Payer: BLUE CROSS/BLUE SHIELD | Source: Ambulatory Visit | Attending: Obstetrics and Gynecology | Admitting: Obstetrics and Gynecology

## 2016-10-05 ENCOUNTER — Other Ambulatory Visit: Payer: Self-pay | Admitting: Obstetrics & Gynecology

## 2016-10-05 ENCOUNTER — Encounter (HOSPITAL_COMMUNITY): Payer: Self-pay

## 2016-10-05 DIAGNOSIS — O98313 Other infections with a predominantly sexual mode of transmission complicating pregnancy, third trimester: Secondary | ICD-10-CM | POA: Insufficient documentation

## 2016-10-05 DIAGNOSIS — O98513 Other viral diseases complicating pregnancy, third trimester: Principal | ICD-10-CM

## 2016-10-05 DIAGNOSIS — Z3A39 39 weeks gestation of pregnancy: Secondary | ICD-10-CM | POA: Insufficient documentation

## 2016-10-05 DIAGNOSIS — B009 Herpesviral infection, unspecified: Secondary | ICD-10-CM

## 2016-10-05 DIAGNOSIS — A6000 Herpesviral infection of urogenital system, unspecified: Secondary | ICD-10-CM | POA: Insufficient documentation

## 2016-10-05 NOTE — Progress Notes (Signed)
Maternal Fetal Medicine Consultation  Requesting Provider(s): Kulwa  Primary Ob: Kulwa Reason for consultation: Positive genital culture for HSV  HPI: 20yo P0 at 39+0 weeks, who was seen your office on 09/29/2016 with genital lesions suspicious for HSV. 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 Since then, the lesions have healed. She had no consitutional sympoms accompanying this outbreak. She has never had an outbreak before. She had HSV 1/2 IgM and IgG for 1 and 2 drawn. No IgM was detected, No type 2 IgG was detected, and the IgG for type 1 was positive  OB History: OB History    Gravida Para Term Preterm AB Living   1             SAB TAB Ectopic Multiple Live Births                  PMH:  Past Medical History:  Diagnosis Date  . Allergy   . Anemia   . Ingrown toenail     PSH:  Past Surgical History:  Procedure Laterality Date  . ADENOIDECTOMY  1998  . EYE SURGERY  2002   eye muscle  . MYRINGOTOMY  1998   Meds: Acyclovir, PNV Allergies: miconazole Fh: See EPIC section Soc: See EPIC section  Review of Systems: no vaginal bleeding or cramping/contractions, no LOF, no nausea/vomiting. No genital pain, burning or itching. All other systems reviewed and are negative  A/P: Recent HSV 2 outbreak  The positve culture for HSV 2 and the anibody pattern identifies this as a nonprimary first episode. This risk of neonatal transmission in these cases can be as high as 31%. For this patient with resolved lesions, the main issue is asymptomatic genital tract viral shedding which can cause neonatal transmission. Studies addressing this issue are mixed, with rates of shedding as high as 13% and as low as 0.5%. For that reason, many experts recommend cesarean delivery. Cesarean delivery reduces the risk of vertical transmission, but does not eliminate it completely. After a discussion, the patient believes she will opt for a cesarean delivery. As she is already 39  weeks, I suggested she contact your office today to discuss scheduling.  Thank you for the opportunity to be a part of the care of Levi Strausslayna Speich. Please contact our office if we can be of further assistance.   I spent approximately 30 minutes with this patient with over 50% of time spent in face-to-face counseling.

## 2016-10-06 ENCOUNTER — Inpatient Hospital Stay (HOSPITAL_COMMUNITY): Payer: BLUE CROSS/BLUE SHIELD | Admitting: Anesthesiology

## 2016-10-06 ENCOUNTER — Encounter (HOSPITAL_COMMUNITY): Payer: Self-pay | Admitting: *Deleted

## 2016-10-06 ENCOUNTER — Encounter (HOSPITAL_COMMUNITY): Payer: Self-pay

## 2016-10-06 ENCOUNTER — Other Ambulatory Visit (HOSPITAL_COMMUNITY): Payer: Self-pay

## 2016-10-06 ENCOUNTER — Inpatient Hospital Stay (HOSPITAL_COMMUNITY)
Admission: RE | Admit: 2016-10-06 | Payer: BLUE CROSS/BLUE SHIELD | Source: Ambulatory Visit | Admitting: Obstetrics & Gynecology

## 2016-10-06 ENCOUNTER — Inpatient Hospital Stay (HOSPITAL_COMMUNITY)
Admission: AD | Admit: 2016-10-06 | Discharge: 2016-10-09 | DRG: 766 | Disposition: A | Discharge status: Home / Self Care | Payer: BLUE CROSS/BLUE SHIELD | Source: Ambulatory Visit | Attending: Obstetrics & Gynecology | Admitting: Obstetrics & Gynecology

## 2016-10-06 ENCOUNTER — Encounter (HOSPITAL_COMMUNITY)
Admission: AD | Disposition: A | Discharge status: Home / Self Care | Payer: Self-pay | Source: Ambulatory Visit | Attending: Obstetrics & Gynecology

## 2016-10-06 DIAGNOSIS — B009 Herpesviral infection, unspecified: Secondary | ICD-10-CM

## 2016-10-06 DIAGNOSIS — Z3A39 39 weeks gestation of pregnancy: Secondary | ICD-10-CM

## 2016-10-06 DIAGNOSIS — A6000 Herpesviral infection of urogenital system, unspecified: Secondary | ICD-10-CM | POA: Diagnosis present

## 2016-10-06 DIAGNOSIS — O9832 Other infections with a predominantly sexual mode of transmission complicating childbirth: Secondary | ICD-10-CM | POA: Diagnosis present

## 2016-10-06 HISTORY — DX: Anogenital herpesviral infection, unspecified: A60.9

## 2016-10-06 LAB — CBC
HEMATOCRIT: 37.5 % (ref 36.0–46.0)
Hemoglobin: 13.1 g/dL (ref 12.0–15.0)
MCH: 30.3 pg (ref 26.0–34.0)
MCHC: 34.9 g/dL (ref 30.0–36.0)
MCV: 86.8 fL (ref 78.0–100.0)
Platelets: 211 10*3/uL (ref 150–400)
RBC: 4.32 MIL/uL (ref 3.87–5.11)
RDW: 14.6 % (ref 11.5–15.5)
WBC: 12.3 10*3/uL — ABNORMAL HIGH (ref 4.0–10.5)

## 2016-10-06 LAB — TYPE AND SCREEN
ABO/RH(D): O POS
ANTIBODY SCREEN: NEGATIVE

## 2016-10-06 SURGERY — Surgical Case
Anesthesia: Spinal

## 2016-10-06 MED ORDER — OXYCODONE-ACETAMINOPHEN 5-325 MG PO TABS
2.0000 | ORAL_TABLET | ORAL | Status: DC | PRN
  Administered 2016-10-07 – 2016-10-09 (×3): 2 via ORAL
  Filled 2016-10-06 (×3): qty 2

## 2016-10-06 MED ORDER — OXYTOCIN 10 UNIT/ML IJ SOLN
INTRAMUSCULAR | Status: AC
  Filled 2016-10-06: qty 4

## 2016-10-06 MED ORDER — OXYTOCIN 40 UNITS IN LACTATED RINGERS INFUSION - SIMPLE MED
INTRAVENOUS | Status: AC
  Filled 2016-10-06: qty 1000

## 2016-10-06 MED ORDER — LACTATED RINGERS IV SOLN: INTRAVENOUS | Status: DC

## 2016-10-06 MED ORDER — KETOROLAC TROMETHAMINE 30 MG/ML IJ SOLN: 30.0000 mg | Freq: Four times a day (QID) | INTRAMUSCULAR | Status: AC | PRN

## 2016-10-06 MED ORDER — PROMETHAZINE HCL 25 MG/ML IJ SOLN: 6.2500 mg | INTRAMUSCULAR | Status: DC | PRN

## 2016-10-06 MED ORDER — ACETAMINOPHEN 325 MG PO TABS
650.0000 mg | ORAL_TABLET | ORAL | Status: DC | PRN
  Administered 2016-10-07 – 2016-10-08 (×2): 650 mg via ORAL
  Filled 2016-10-06 (×2): qty 2

## 2016-10-06 MED ORDER — SODIUM CHLORIDE 0.9 % IR SOLN
Status: DC | PRN
  Administered 2016-10-06: 1000 mL

## 2016-10-06 MED ORDER — MIDAZOLAM HCL 2 MG/2ML IJ SOLN
INTRAMUSCULAR | Status: DC | PRN
  Administered 2016-10-06 (×2): 1 mg via INTRAVENOUS

## 2016-10-06 MED ORDER — NALBUPHINE HCL 10 MG/ML IJ SOLN: 5.0000 mg | Freq: Once | INTRAMUSCULAR | Status: DC | PRN

## 2016-10-06 MED ORDER — HYDROMORPHONE HCL 1 MG/ML IJ SOLN: 0.2500 mg | INTRAMUSCULAR | Status: DC | PRN

## 2016-10-06 MED ORDER — FAMOTIDINE IN NACL 20-0.9 MG/50ML-% IV SOLN
20.0000 mg | Freq: Once | INTRAVENOUS | Status: AC
  Administered 2016-10-06: 20 mg via INTRAVENOUS
  Filled 2016-10-06: qty 50

## 2016-10-06 MED ORDER — FENTANYL CITRATE (PF) 100 MCG/2ML IJ SOLN
INTRAMUSCULAR | Status: AC
  Filled 2016-10-06: qty 2

## 2016-10-06 MED ORDER — IBUPROFEN 600 MG PO TABS
600.0000 mg | ORAL_TABLET | Freq: Four times a day (QID) | ORAL | Status: DC
  Administered 2016-10-07 – 2016-10-09 (×10): 600 mg via ORAL
  Filled 2016-10-06 (×10): qty 1

## 2016-10-06 MED ORDER — WITCH HAZEL-GLYCERIN EX PADS: 1.0000 "application " | MEDICATED_PAD | CUTANEOUS | Status: DC | PRN

## 2016-10-06 MED ORDER — OXYTOCIN 10 UNIT/ML IJ SOLN
INTRAVENOUS | Status: DC | PRN
  Administered 2016-10-06: 40 [IU] via INTRAVENOUS

## 2016-10-06 MED ORDER — MORPHINE SULFATE-NACL 0.5-0.9 MG/ML-% IV SOSY
PREFILLED_SYRINGE | INTRAVENOUS | Status: AC
  Filled 2016-10-06: qty 1

## 2016-10-06 MED ORDER — DIPHENHYDRAMINE HCL 25 MG PO CAPS
25.0000 mg | ORAL_CAPSULE | Freq: Four times a day (QID) | ORAL | Status: DC | PRN
  Filled 2016-10-06: qty 1

## 2016-10-06 MED ORDER — MEPERIDINE HCL 25 MG/ML IJ SOLN
INTRAMUSCULAR | Status: AC
  Filled 2016-10-06: qty 1

## 2016-10-06 MED ORDER — ONDANSETRON HCL 4 MG/2ML IJ SOLN
INTRAMUSCULAR | Status: DC | PRN
  Administered 2016-10-06: 4 mg via INTRAVENOUS

## 2016-10-06 MED ORDER — OXYCODONE-ACETAMINOPHEN 5-325 MG PO TABS
1.0000 | ORAL_TABLET | ORAL | Status: DC | PRN
  Administered 2016-10-08 (×2): 1 via ORAL
  Filled 2016-10-06 (×3): qty 1

## 2016-10-06 MED ORDER — SOD CITRATE-CITRIC ACID 500-334 MG/5ML PO SOLN
30.0000 mL | Freq: Once | ORAL | Status: AC
  Administered 2016-10-06: 30 mL via ORAL
  Filled 2016-10-06: qty 15

## 2016-10-06 MED ORDER — DIPHENHYDRAMINE HCL 25 MG PO CAPS
25.0000 mg | ORAL_CAPSULE | ORAL | Status: DC | PRN
  Filled 2016-10-06: qty 1

## 2016-10-06 MED ORDER — COCONUT OIL OIL
1.0000 "application " | TOPICAL_OIL | Status: DC | PRN
  Administered 2016-10-08: 1 via TOPICAL
  Filled 2016-10-06 (×2): qty 120

## 2016-10-06 MED ORDER — CEFAZOLIN SODIUM-DEXTROSE 2-4 GM/100ML-% IV SOLN
2.0000 g | INTRAVENOUS | Status: DC
  Filled 2016-10-06: qty 100

## 2016-10-06 MED ORDER — NALBUPHINE HCL 10 MG/ML IJ SOLN: 5.0000 mg | INTRAMUSCULAR | Status: DC | PRN

## 2016-10-06 MED ORDER — OXYTOCIN 40 UNITS IN LACTATED RINGERS INFUSION - SIMPLE MED
2.5000 [IU]/h | INTRAVENOUS | Status: AC
  Administered 2016-10-06: 2.5 [IU]/h via INTRAVENOUS

## 2016-10-06 MED ORDER — MEPERIDINE HCL 25 MG/ML IJ SOLN
INTRAMUSCULAR | Status: DC | PRN
  Administered 2016-10-06 (×2): 12.5 mg via INTRAVENOUS

## 2016-10-06 MED ORDER — MENTHOL 3 MG MT LOZG
1.0000 | LOZENGE | OROMUCOSAL | Status: DC | PRN
  Filled 2016-10-06: qty 9

## 2016-10-06 MED ORDER — SODIUM CHLORIDE 0.9% FLUSH: 3.0000 mL | INTRAVENOUS | Status: DC | PRN

## 2016-10-06 MED ORDER — MORPHINE SULFATE-NACL 0.5-0.9 MG/ML-% IV SOSY
PREFILLED_SYRINGE | INTRAVENOUS | Status: DC | PRN
  Administered 2016-10-06: .3 mg via INTRAVENOUS

## 2016-10-06 MED ORDER — DIBUCAINE 1 % RE OINT: 1.0000 "application " | TOPICAL_OINTMENT | RECTAL | Status: DC | PRN

## 2016-10-06 MED ORDER — MEPERIDINE HCL 25 MG/ML IJ SOLN: 6.2500 mg | INTRAMUSCULAR | Status: DC | PRN

## 2016-10-06 MED ORDER — MORPHINE SULFATE (PF) 0.5 MG/ML IJ SOLN
INTRAMUSCULAR | Status: DC | PRN
  Administered 2016-10-06: .2 mg via EPIDURAL

## 2016-10-06 MED ORDER — ZOLPIDEM TARTRATE 5 MG PO TABS: 5.0000 mg | ORAL_TABLET | Freq: Every evening | ORAL | Status: DC | PRN

## 2016-10-06 MED ORDER — LACTATED RINGERS IV BOLUS (SEPSIS)
1000.0000 mL | Freq: Once | INTRAVENOUS | Status: AC
  Administered 2016-10-06: 1000 mL via INTRAVENOUS

## 2016-10-06 MED ORDER — KETOROLAC TROMETHAMINE 30 MG/ML IJ SOLN
INTRAMUSCULAR | Status: AC
  Administered 2016-10-06: 30 mg
  Filled 2016-10-06: qty 1

## 2016-10-06 MED ORDER — NALOXONE HCL 2 MG/2ML IJ SOSY
1.0000 ug/kg/h | PREFILLED_SYRINGE | INTRAMUSCULAR | Status: DC | PRN
  Filled 2016-10-06: qty 2

## 2016-10-06 MED ORDER — PRENATAL MULTIVITAMIN CH
1.0000 | ORAL_TABLET | Freq: Every day | ORAL | Status: DC
  Administered 2016-10-07 – 2016-10-09 (×3): 1 via ORAL
  Filled 2016-10-06 (×4): qty 1

## 2016-10-06 MED ORDER — DIPHENHYDRAMINE HCL 50 MG/ML IJ SOLN: 12.5000 mg | INTRAMUSCULAR | Status: DC | PRN

## 2016-10-06 MED ORDER — NALOXONE HCL 0.4 MG/ML IJ SOLN: 0.4000 mg | INTRAMUSCULAR | Status: DC | PRN

## 2016-10-06 MED ORDER — SIMETHICONE 80 MG PO CHEW
80.0000 mg | CHEWABLE_TABLET | ORAL | Status: DC | PRN
  Administered 2016-10-08: 80 mg via ORAL
  Filled 2016-10-06 (×2): qty 1

## 2016-10-06 MED ORDER — ONDANSETRON HCL 4 MG/2ML IJ SOLN
4.0000 mg | Freq: Three times a day (TID) | INTRAMUSCULAR | Status: DC | PRN
  Administered 2016-10-07: 4 mg via INTRAVENOUS
  Filled 2016-10-06: qty 2

## 2016-10-06 MED ORDER — PHENYLEPHRINE 8 MG IN D5W 100 ML (0.08MG/ML) PREMIX OPTIME
INJECTION | INTRAVENOUS | Status: DC | PRN
  Administered 2016-10-06: 60 ug/min via INTRAVENOUS

## 2016-10-06 MED ORDER — CEFAZOLIN SODIUM-DEXTROSE 2-4 GM/100ML-% IV SOLN
2.0000 g | INTRAVENOUS | Status: AC
  Administered 2016-10-06: 2 g via INTRAVENOUS

## 2016-10-06 MED ORDER — SCOPOLAMINE 1 MG/3DAYS TD PT72: 1.0000 | MEDICATED_PATCH | Freq: Once | TRANSDERMAL | Status: DC

## 2016-10-06 MED ORDER — ONDANSETRON HCL 4 MG/2ML IJ SOLN
INTRAMUSCULAR | Status: AC
  Filled 2016-10-06: qty 2

## 2016-10-06 MED ORDER — MIDAZOLAM HCL 2 MG/2ML IJ SOLN
INTRAMUSCULAR | Status: AC
Start: 2016-10-06 — End: 2016-10-06
  Filled 2016-10-06: qty 2

## 2016-10-06 MED ORDER — SENNOSIDES-DOCUSATE SODIUM 8.6-50 MG PO TABS
2.0000 | ORAL_TABLET | ORAL | Status: DC
  Administered 2016-10-07 – 2016-10-08 (×3): 2 via ORAL
  Filled 2016-10-06 (×5): qty 2

## 2016-10-06 MED ORDER — BUPIVACAINE IN DEXTROSE 0.75-8.25 % IT SOLN
INTRATHECAL | Status: DC | PRN
  Administered 2016-10-06: 1.6 mg via INTRATHECAL

## 2016-10-06 MED ORDER — LACTATED RINGERS IV SOLN
INTRAVENOUS | Status: DC
  Administered 2016-10-06 (×3): via INTRAVENOUS

## 2016-10-06 MED ORDER — FENTANYL CITRATE (PF) 100 MCG/2ML IJ SOLN
INTRAMUSCULAR | Status: DC | PRN
  Administered 2016-10-06: 90 ug via INTRAVENOUS
  Administered 2016-10-06: 10 ug via INTRAVENOUS

## 2016-10-06 SURGICAL SUPPLY — 44 items
ADH SKN CLS APL DERMABOND .7 (GAUZE/BANDAGES/DRESSINGS)
APL SKNCLS STERI-STRIP NONHPOA (GAUZE/BANDAGES/DRESSINGS) ×1
BENZOIN TINCTURE PRP APPL 2/3 (GAUZE/BANDAGES/DRESSINGS) ×2 IMPLANT
CHLORAPREP W/TINT 26ML (MISCELLANEOUS) ×3 IMPLANT
CLAMP CORD UMBIL (MISCELLANEOUS) IMPLANT
CLOSURE WOUND 1/2 X4 (GAUZE/BANDAGES/DRESSINGS) ×1
CLOTH BEACON ORANGE TIMEOUT ST (SAFETY) ×3 IMPLANT
DERMABOND ADVANCED (GAUZE/BANDAGES/DRESSINGS)
DERMABOND ADVANCED .7 DNX12 (GAUZE/BANDAGES/DRESSINGS) IMPLANT
DRAPE C SECTION CLR SCREEN (DRAPES) ×3 IMPLANT
DRSG OPSITE POSTOP 4X10 (GAUZE/BANDAGES/DRESSINGS) ×5 IMPLANT
ELECT REM PT RETURN 9FT ADLT (ELECTROSURGICAL) ×3
ELECTRODE REM PT RTRN 9FT ADLT (ELECTROSURGICAL) ×1 IMPLANT
EXTRACTOR VACUUM M CUP 4 TUBE (SUCTIONS) IMPLANT
EXTRACTOR VACUUM M CUP 4' TUBE (SUCTIONS)
GLOVE BIOGEL PI IND STRL 7.0 (GLOVE) ×2 IMPLANT
GLOVE BIOGEL PI INDICATOR 7.0 (GLOVE) ×4
GLOVE SURG SS PI 6.5 STRL IVOR (GLOVE) ×3 IMPLANT
GOWN STRL REUS W/TWL LRG LVL3 (GOWN DISPOSABLE) ×6 IMPLANT
KIT ABG SYR 3ML LUER SLIP (SYRINGE) IMPLANT
LIQUID BAND (GAUZE/BANDAGES/DRESSINGS) ×2 IMPLANT
NDL HYPO 25X1 1.5 SAFETY (NEEDLE) IMPLANT
NDL HYPO 25X5/8 SAFETYGLIDE (NEEDLE) IMPLANT
NEEDLE HYPO 25X1 1.5 SAFETY (NEEDLE) ×6 IMPLANT
NEEDLE HYPO 25X5/8 SAFETYGLIDE (NEEDLE) IMPLANT
NS IRRIG 1000ML POUR BTL (IV SOLUTION) ×3 IMPLANT
PACK C SECTION WH (CUSTOM PROCEDURE TRAY) ×3 IMPLANT
PAD OB MATERNITY 4.3X12.25 (PERSONAL CARE ITEMS) ×3 IMPLANT
PENCIL SMOKE EVAC W/HOLSTER (ELECTROSURGICAL) ×3 IMPLANT
RTRCTR C-SECT PINK 25CM LRG (MISCELLANEOUS) ×3 IMPLANT
STRIP CLOSURE SKIN 1/2X4 (GAUZE/BANDAGES/DRESSINGS) ×1 IMPLANT
SUT CHROMIC 1 CTX 36 (SUTURE) IMPLANT
SUT CHROMIC 2 0 CT 1 (SUTURE) ×3 IMPLANT
SUT MON AB 4-0 PS1 27 (SUTURE) ×3 IMPLANT
SUT PLAIN 1 NONE 54 (SUTURE) IMPLANT
SUT PLAIN 2 0 (SUTURE) ×3
SUT PLAIN 2 0 XLH (SUTURE) IMPLANT
SUT PLAIN ABS 2-0 CT1 27XMFL (SUTURE) IMPLANT
SUT VIC AB 0 CTX 36 (SUTURE) ×3
SUT VIC AB 0 CTX36XBRD ANBCTRL (SUTURE) ×1 IMPLANT
SUT VIC AB 1 CTX 36 (SUTURE) ×6
SUT VIC AB 1 CTX36XBRD ANBCTRL (SUTURE) ×2 IMPLANT
TOWEL OR 17X24 6PK STRL BLUE (TOWEL DISPOSABLE) ×3 IMPLANT
TRAY FOLEY CATH SILVER 14FR (SET/KITS/TRAYS/PACK) ×3 IMPLANT

## 2016-10-06 NOTE — H&P (Addendum)
Tanya Pierce is a 20 y.o. female presenting for Primary Ceserean Section due to primary outbreak of herpes in third trimester. Delivery Reccommnedation given by MFM.Marland Kitchen.  Pregnancy followed at [facility] since 11  weeks and remarkable for: Herpes   OB History    Gravida Para Term Preterm AB Living   1             SAB TAB Ectopic Multiple Live Births                 Past Medical History:  Diagnosis Date  . Allergy   . Anemia   . HSV (herpes simplex virus) anogenital infection   . Ingrown toenail    Past Surgical History:  Procedure Laterality Date  . ADENOIDECTOMY  1998  . EYE SURGERY  2002   eye muscle  . MYRINGOTOMY  1998    Family History:   family history includes Cancer in her maternal grandfather; Diabetes in her paternal grandfather and paternal grandmother; Heart disease in her paternal grandfather; Hyperlipidemia in her maternal grandmother and mother; Hypertension in her father. Social History:    reports that she has never smoked. She has never used smokeless tobacco. She reports that she does not drink alcohol or use drugs.   Prenatal labs: ABO, Rh: --/--/O POS, O POS (11/07 1516) Antibody: NEG (11/07 1516) Rubella: !Error!Immune RPR:   NR HBsAg:   Negative HIV:   Negative GBS:   Negative   Prenatal Transfer Tool  Maternal Diabetes: No Genetic Screening: Declined Maternal Ultrasounds/Referrals: Normal Fetal Ultrasounds or other Referrals:  None Maternal Substance Abuse:  No Significant Maternal Medications:  Meds include: Other: acyclovir 400mg  Significant Maternal Lab Results: Lab values include: Other: HSV 2     Blood pressure 109/66, pulse 77, temperature 98.3 F (36.8 C), temperature source Oral, resp. rate 16, height 5\' 4"  (1.626 m), weight 145 lb (65.8 kg), last menstrual period 01/06/2016.  General Appearance: Alert, appropriate appearance for age. No acute distress HEENT Exam: Grossly normal Chest/Respiratory Exam: Normal chest wall and  respirations. Clear to auscultation Cardiovascular Exam: Regular rate and rhythm. S1, S2, no murmur Gastrointestinal Exam: soft, non-tender, Uterus gravid with size compatible with GA, Vertex presentation by Leopold's maneuvers Psychiatric Exam: Alert and oriented, appropriate affect  ++++++++++++++++++++++++++++++++++++++++++++++++++++++++++++++++    Fetal tracings: Category 1  ++++++++++++++++++++++++++++++++++++++++++++++++++++++++++++++++   Assessment/Plan: IUP @ 39+1 Primary C/S for initial herpes outbreak in 3rd trimester     Illene BolusLori Clemmons CNM 10/06/2016, 4:25 PM   I discussed with the patient risks, benefits and alternatives of the cesarean section including risks of bleeding, infection and damage to organs. All her questions were answered and she consented to the procedure.   Dr. Sallye OberKulwa.

## 2016-10-06 NOTE — Consult Note (Signed)
[facility]  Delivery Note:  C-section       10/06/2016  6:08 PM  I was called to the operating room at the request of the patient's obstetrician (Dr. Sallye OberKulwa) for a primary c-section.  PRENATAL HX:  This is a 20 y/o G1P0 at 3339 and 1/[redacted] weeks gestation who presents for primary c-section due to recent primary HSV outbreak.  She does not have any active lesions and is on prophylactic valtrex.  Her pregnancy has been otherwise uncomplicated.   INTRAPARTUM HX:   Primary c-section with AROM at delivery.  Vacuum assisted.   DELIVERY:  Infant was vigorous at delivery, requiring no resuscitation other than standard warming, drying and stimulation.  APGARs 8 and 9.  Exam within normal limits.  After 5 minutes, baby left with nurse to assist parents with skin-to-skin care.   _____________________ Electronically Signed By: Maryan CharLindsey Syla Devoss, MD Neonatologist

## 2016-10-06 NOTE — Anesthesia Preprocedure Evaluation (Addendum)
Anesthesia Evaluation  Patient identified by MRN, date of birth, ID band Patient awake    Reviewed: Allergy & Precautions, NPO status , Patient's Chart, lab work & pertinent test results  History of Anesthesia Complications Negative for: history of anesthetic complications  Airway Mallampati: II  TM Distance: >3 FB     Dental no notable dental hx. (+) Dental Advisory Given   Pulmonary neg pulmonary ROS,    Pulmonary exam normal        Cardiovascular negative cardio ROS Normal cardiovascular exam     Neuro/Psych negative neurological ROS  negative psych ROS   GI/Hepatic negative GI ROS, Neg liver ROS,   Endo/Other  negative endocrine ROS  Renal/GU negative Renal ROS  negative genitourinary   Musculoskeletal negative musculoskeletal ROS (+)   Abdominal   Peds negative pediatric ROS (+)  Hematology negative hematology ROS (+)   Anesthesia Other Findings   Reproductive/Obstetrics (+) Pregnancy                            Anesthesia Physical Anesthesia Plan  ASA: II  Anesthesia Plan: Spinal   Post-op Pain Management:    Induction:   Airway Management Planned: Natural Airway  Additional Equipment:   Intra-op Plan:   Post-operative Plan: Extubation in OR  Informed Consent: I have reviewed the patients History and Physical, chart, labs and discussed the procedure including the risks, benefits and alternatives for the proposed anesthesia with the patient or authorized representative who has indicated his/her understanding and acceptance.   Dental advisory given  Plan Discussed with: CRNA, Anesthesiologist and Surgeon  Anesthesia Plan Comments:        Anesthesia Quick Evaluation

## 2016-10-06 NOTE — MAU Note (Signed)
Patient exposed to HSV. Sent for add-on C/S.

## 2016-10-06 NOTE — Op Note (Signed)
Patient:  Tanya Pierce, Kaiyah DOB:  06-Sep-1996 MRN:  [id number]  DATE OF SURGERY: 10/06/2016  PREOP DIAGNOSIS:  1. 39 week 1 day  EGA intrauterine pregnancy 2.  Recent initial herpes outbreak and cesarean delivery recommended by MFM to decrease neonatal perinatal infection.       POSTOP DIAGNOSIS: Same as above.  PROCEDURE: Primary low uterine segment transverse cesarean section via Pfannenstiel incision.     SURGEON: Dr.  Angus PalmsEMA Sallye OberKULWA  ASSISTANT: Illene BolusLori Clemmons, CNM  ANESTHESIA: Spinal  COMPLICATIONS: None  FINDINGS: Viable female infant in cephalic presentation, DOP, weight pending, Apgar scores of 8 and 9. Normal uterus and fallopian tubes and ovaries bilaterally.    EBL:  600 cc  IV FLUID:  2000 cc LR   URINE OUTPUT: 200 cc clear urine  INDICATIONS: 20 y/o P0 who had an initial HSV outbreak about 1 week ago, confirmed with positive HSV cultures.  She had an MFM consultation and a cesarean delivery was recommended to decrease the risk of HSV neonatal perinatal transmission.   She was consented for the procedure after explaining the risks, benefits and alternatives of the procedure.    PROCEDURE:   Informed consent was obtained from the patient to undergo the procedure. She was taken to the operating room where her spinal anesthesia was found to be adequate. She was prepped and draped in the usual sterile fashion and a Foley catheter was placed. She received 2 g of IV Ancef preoperatively.  A Pfannenstiel incision was made with the scalpel and the incision extended through the subcutaneous layer and also the fascia with the bovie. Small perforators in the subcutaneous layer were contained with the Bovie. The fascia was nicked in the midline and then was further separated from the rectus muscles bilaterally using Mayo scissors. Kochers were placed inferiorly and then superiorly to allow further separation of fascia from the rectus muscles.  The peritoneal cavity was entered bluntly with the  fingers. The Alexis retractor was placed in. The bladder flap was created using Metzenbaum scissors.   The uterus was incised with a scalpel and the incision extended bluntly bilaterally with fingers. Intact membranes were encountered, membranes ruptured and clear amniotic fluid was noted.  There was a funic presentation delivering with the head.  Head was wedged in the pelvis and was difficult to deliver with abdominal pressure and flexion.  Vacuum was then applied to the head and delivered with 1 pull.  Then the rest of the body was delivered with abdominal pressure.  She delivered a viable female infant, apgar scores 8,9.  The cord was clamped and cut. Cord blood was collected.  Baby was handed over to the pediatrics team who were aware of recent maternal HSV infection.     The uterus was not exteriorized.  Cord PH and Cord blood were collected.  The placenta was delivered with gentle traction on the umbilical cord. The edges of the uterus was grasped with Allis clamps and also T. Clamps. The uterus was cleared of clots and debris with a lap.  The incision uterine incision was closed with #1 Vicryl in a running locked stitch. An imbricating layer of same stitch was placed over the initial closure.  Irrigation was applied and suctioned out. Excellent hemostasis was noted over the incision.  The muscles were then reapproximated using chromic suture.  Fascia was closed using 0 Vicryl in a running stitch. The subcutaneous layer was irrigated and suctioned out. Small perforators were contained with the bovie.  The subcutaneous  was closed over using 1-0 plain in interrupted stitches. The skin was closed using 4-0 Monocryl. Dermabond was applied. Honeycomb was then applied. The patient was then cleaned and she was taken to the recovery room in stable condition with her neonate.    SPECIMEN: Placenta, umbilical cord blood and cord PH.   DISPOSITION: TO PACU, STABLE.

## 2016-10-06 NOTE — Anesthesia Procedure Notes (Signed)
Spinal  Patient location during procedure: OR Start time: 10/06/2016 5:34 PM End time: 10/06/2016 5:44 PM Staffing Anesthesiologist: Heather RobertsSINGER, Lemoine Goyne Performed: anesthesiologist  Preanesthetic Checklist Completed: patient identified, surgical consent, pre-op evaluation, timeout performed, IV checked, risks and benefits discussed and monitors and equipment checked Spinal Block Patient position: sitting Prep: DuraPrep Patient monitoring: cardiac monitor, continuous pulse ox and blood pressure Approach: midline Location: L2-3 Injection technique: single-shot Needle Needle type: Pencan  Needle gauge: 24 G Needle length: 9 cm Additional Notes Functioning IV was confirmed and monitors were applied. Sterile prep and drape, including hand hygiene and sterile gloves were used. The patient was positioned and the spine was prepped. The skin was anesthetized with lidocaine.  Free flow of clear CSF was obtained prior to injecting local anesthetic into the CSF.  The spinal needle aspirated freely following injection.  The needle was carefully withdrawn.  The patient tolerated the procedure well.

## 2016-10-06 NOTE — MAU Note (Signed)
Patient states she had her first out break last week and she did not take her medication.

## 2016-10-06 NOTE — Transfer of Care (Signed)
Immediate Anesthesia Transfer of Care Note  Patient: Tanya Pierce  Procedure(s) Performed: Procedure(s): CESAREAN SECTION (N/A)  Patient Location: PACU  Anesthesia Type:Spinal  Level of Consciousness: sedated  Airway & Oxygen Therapy: Patient Spontanous Breathing  Post-op Assessment: Report given to RN and Post -op Vital signs reviewed and stable  Post vital signs: Reviewed and stable  Last Vitals:  Vitals:   10/06/16 1620  BP: 109/66  Pulse: 77  Resp: 16  Temp: 36.8 C    Last Pain:  Vitals:   10/06/16 1620  TempSrc: Oral         Complications: No apparent anesthesia complications

## 2016-10-06 NOTE — Anesthesia Postprocedure Evaluation (Signed)
Anesthesia Post Note  Patient: Celedonio Miyamotolayna Kesinger  Procedure(s) Performed: Procedure(s) (LRB): CESAREAN SECTION (N/A)  Patient location during evaluation: Mother Baby Anesthesia Type: Spinal Level of consciousness: awake and alert and oriented Pain management: satisfactory to patient Vital Signs Assessment: post-procedure vital signs reviewed and stable Respiratory status: respiratory function stable and spontaneous breathing Cardiovascular status: blood pressure returned to baseline Postop Assessment: no headache, no backache, spinal receding, patient able to bend at knees and adequate PO intake Anesthetic complications: no     Last Vitals:  Vitals:   10/06/16 2043 10/06/16 2151  BP: (!) 96/50 (!) 105/49  Pulse: 67 61  Resp: 16 18  Temp: 36.6 C 36.7 C    Last Pain:  Vitals:   10/06/16 2151  TempSrc: Oral  PainSc: 5    Pain Goal: Patients Stated Pain Goal: 2 (10/06/16 2000)               Karleen DolphinFUSSELL,Katreena Schupp

## 2016-10-06 NOTE — Brief Op Note (Signed)
10/06/2016  7:10 PM  PATIENT:  Tanya MiyamotoAlayna Pierce  20 y.o. female  PRE-OPERATIVE DIAGNOSIS:  Recent Initial HSV outbreak; positive HSV2 culture  POST-OPERATIVE DIAGNOSIS:  Recent Initial HSV outbreak; positive HSV2 culture  PROCEDURE:  Procedure(s): CESAREAN SECTION (N/A)  SURGEON:  Surgeon(s) and Role:    * Hoover BrownsEma Chai Verdejo, MD - Primary  ASSISTANTS: Illene BolusLori Clemmons, CNM   ANESTHESIA:   spinal  EBL:  No intake/output data recorded.  BLOOD ADMINISTERED:none  DRAINS: none   LOCAL MEDICATIONS USED:  NONE  SPECIMEN:   Source of Specimen:  Placenta, cord PH and cord blood.  DISPOSITION OF SPECIMEN:  PATHOLOGY  COUNTS:  YES  TOURNIQUET:  * No tourniquets in log *  DICTATION: .Dragon Dictation  PLAN OF CARE: Admit to inpatient   PATIENT DISPOSITION:  PACU - hemodynamically stable.   Delay start of Pharmacological VTE agent (>24hrs) due to surgical blood loss or risk of bleeding: not applicable

## 2016-10-07 ENCOUNTER — Encounter (HOSPITAL_COMMUNITY): Payer: Self-pay | Admitting: Obstetrics and Gynecology

## 2016-10-07 LAB — CBC
HCT: 31.6 % — ABNORMAL LOW (ref 36.0–46.0)
HEMOGLOBIN: 11 g/dL — AB (ref 12.0–15.0)
MCH: 29.9 pg (ref 26.0–34.0)
MCHC: 34.8 g/dL (ref 30.0–36.0)
MCV: 85.9 fL (ref 78.0–100.0)
Platelets: 174 10*3/uL (ref 150–400)
RBC: 3.68 MIL/uL — AB (ref 3.87–5.11)
RDW: 14.7 % (ref 11.5–15.5)
WBC: 9 10*3/uL (ref 4.0–10.5)

## 2016-10-07 LAB — RPR: RPR: NONREACTIVE

## 2016-10-07 NOTE — Progress Notes (Signed)
Tanya Pierce [id number]  Subjective: Postpartum Day 1: POD 1 C/S due to primary outbreak Herpes Patient up x 1 just to stand reports no syncope or dizziness. Feeding: breastfeeding   Objective: Temp:  [97.8 F (36.6 C)-98.6 F (37 C)] 97.8 F (36.6 C) (12/16 1210) Pulse Rate:  [61-77] 64 (12/16 1210) Resp:  [10-19] 18 (12/16 1210) BP: (94-109)/(30-66) 96/56 (12/16 1210) SpO2:  [95 %-100 %] 100 % (12/16 1210) Weight:  [145 lb (65.8 kg)] 145 lb (65.8 kg) (12/15 1612)  CBC Latest Ref Rng & Units 10/07/2016 10/06/2016 08/29/2016  WBC 4.0 - 10.5 K/uL 9.0 12.3(H) 14.7(H)  Hemoglobin 12.0 - 15.0 g/dL 11.0(L) 13.1 12.5  Hematocrit 36.0 - 46.0 % 31.6(L) 37.5 35.9(L)  Platelets 150 - 400 K/uL 174 211 208     Physical Exam:  General: alert, cooperative and no distress Lochia: appropriate Uterine Fundus: firm Abdomen:  + bowel sounds, hypo Incision: Honeycomb dressing CDI DVT Evaluation: No evidence of DVT seen on physical exam.   Assessment/Plan: Status post cesarean delivery, day 1. Stable Continue current care. Breastfeeding and Circumcision prior to discharge    Rhea PinkLori A Clemmons MSN, CNM 10/07/2016, 12:37 PM

## 2016-10-07 NOTE — Lactation Note (Signed)
This note was copied from a baby's chart. Lactation Consultation Note  Patient Name: Tanya Pierce: 10/07/2016 Reason for consult: Initial assessment Baby at 22 hr of life. Mom reports bf is better today. She denies breast or nipple pain. She states after having her nipples pierced 1.5 yr ago her nipples lost sensitivity. She can feel the baby "tuggin" at the breast. Mom is having c/s related pain. She is jumpy/nervious about anything, including the baby, touching her stomach. Mom has a NS in the room but, baby latched well to both breast without it. Discussed baby behavior, feeding frequency, baby belly size, voids, wt loss, breast changes, and nipple care. Demonstrated manual expression, colostrum noted bilaterally, spoon in room. Given lactation handouts. Aware of OP services and support group.      Maternal Data Has patient been taught Hand Expression?: Yes Does the patient have breastfeeding experience prior to this delivery?: No  Feeding Feeding Type: Breast Fed Length of feed: 10 min  LATCH Score/Interventions Latch: Grasps breast easily, tongue down, lips flanged, rhythmical sucking. Intervention(s): Adjust position;Breast compression  Audible Swallowing: Spontaneous and intermittent Intervention(s): Alternate breast massage;Hand expression;Skin to skin  Type of Nipple: Flat Intervention(s): No intervention needed  Comfort (Breast/Nipple): Soft / non-tender     Hold (Positioning): Full assist, staff holds infant at breast Intervention(s): Support Pillows;Position options  LATCH Score: 7  Lactation Tools Discussed/Used     Consult Status Consult Status: Follow-up Pierce: 10/08/16 Follow-up type: In-patient    Rulon Eisenmengerlizabeth E Taichi Repka 10/07/2016, 4:17 PM

## 2016-10-07 NOTE — Anesthesia Postprocedure Evaluation (Signed)
Anesthesia Post Note  Patient: Tanya Pierce  Procedure(s) Performed: Procedure(s) (LRB): CESAREAN SECTION (N/A)  Patient location during evaluation: PACU Anesthesia Type: Spinal Level of consciousness: awake Pain management: pain level controlled Vital Signs Assessment: post-procedure vital signs reviewed and stable Respiratory status: spontaneous breathing Cardiovascular status: stable Postop Assessment: no headache, no backache, spinal receding, patient able to bend at knees and no signs of nausea or vomiting Anesthetic complications: no     Last Vitals:  Vitals:   10/06/16 2305 10/07/16 0034  BP: (!) 103/44 (!) 96/51  Pulse: 64 64  Resp: 18 16  Temp: 36.8 C 36.8 C    Last Pain:  Vitals:   10/07/16 0034  TempSrc:   PainSc: 5    Pain Goal: Patients Stated Pain Goal: 2 (10/06/16 2000)               Inas Avena JR,JOHN Susann GivensFRANKLIN

## 2016-10-08 NOTE — Progress Notes (Signed)
Saw Dr. Eddie Candleummings at nurses' station. He talked to me the possible need for a social work consultation to check in on patient, as evidenced by mother's flat affect on admission and the sister/friend of MOB pointing from behind FOB's head indicating there was an issue.

## 2016-10-08 NOTE — Progress Notes (Addendum)
Post Partum Day 2 POD 2 Subjective:  Well. Lochia are normal. Voiding, ambulating, tolerating normal diet. Has not had a bowel movement yet nursing going well.  Objective: Blood pressure (!) 112/58, pulse 64, temperature 97.7 F (36.5 C), temperature source Oral, resp. rate 20, height 5\' 4"  (1.626 m), weight 145 lb (65.8 kg), last menstrual period 01/06/2016, SpO2 100 %, unknown if currently breastfeeding.  Physical Exam:  General: normal Lochia: appropriate Uterine Fundus: @1 /U firm non-tender Honeycomb dressing dry , intact, no drainage Positive bowel sounds  Extremities: No evidence of DVT seen on physical exam. Edema none     Recent Labs  10/06/16 1636 10/07/16 0521  HGB 13.1 11.0*  HCT 37.5 31.6*    Assessment/Plan: Normal Post-partum. Continue routine post-partum care. Anticipate discharge tomorrow Circumcision tomorrow    LOS: 2 days   Rhea PinkLori A Charls Custer MD 10/08/2016, 9:49 AM

## 2016-10-08 NOTE — Progress Notes (Signed)
CSW received consult to complete psychosocial assessment.  CSW attempted to meet with MOB, but she had numerous visitors at this time and requested that CSW return in the am.  CSW will attempt again tomorrow.   

## 2016-10-08 NOTE — Lactation Note (Signed)
This note was copied from a baby's chart. Lactation Consultation Note  Patient Name: Tanya Pierce NWGNF'AToday's Date: 10/08/2016 Reason for consult: Follow-up assessment Baby at 52 hr of life. Upon entry baby was sleeping sts with mom. Mom is worried because baby has been cluster feeding. She denies breast or nipple pain. Discussed baby behavior, feeding frequency, baby belly size, voids, wt loss, breast changes, and nipple care. Discussed manually expressing/spoon feeding and keeping baby awake at the breast. She is aware of lactation services and support group. She will call as needed.    Maternal Data    Feeding Feeding Type: Breast Fed Length of feed: 15 min  LATCH Score/Interventions Latch: Grasps breast easily, tongue down, lips flanged, rhythmical sucking.  Audible Swallowing: A few with stimulation  Type of Nipple: Everted at rest and after stimulation  Comfort (Breast/Nipple): Soft / non-tender     Hold (Positioning): Assistance needed to correctly position infant at breast and maintain latch.  LATCH Score: 8  Lactation Tools Discussed/Used     Consult Status Consult Status: Follow-up Date: 10/09/16 Follow-up type: In-patient    Rulon Eisenmengerlizabeth E Maelynn Moroney 10/08/2016, 11:00 PM

## 2016-10-08 NOTE — Progress Notes (Signed)
When I had the chance to speak with pt privately, I asked her about how she was feeling emotionally, as myself and other providers had noticed she seemed flat/sad.   She admitted feeling sad, explaining that while she understood that FOB wanted to be a part of his child's life and she was alright with him being present, they were not getting along personally and she felt like he was being dishonest with her. She denied needing anything from the nursing staff nor did she want FOB to not be permitted from the room. I offered emotional support and reassured her that we were here to help her.

## 2016-10-09 MED ORDER — OXYCODONE-ACETAMINOPHEN 5-325 MG PO TABS: 1.0000 | ORAL_TABLET | Freq: Four times a day (QID) | ORAL | 0 refills | Status: DC | PRN

## 2016-10-09 MED ORDER — FAMOTIDINE 20 MG PO TABS
20.0000 mg | ORAL_TABLET | Freq: Once | ORAL | Status: AC
  Administered 2016-10-09: 20 mg via ORAL
  Filled 2016-10-09: qty 1

## 2016-10-09 MED ORDER — SENNOSIDES-DOCUSATE SODIUM 8.6-50 MG PO TABS: 2.0000 | ORAL_TABLET | ORAL | 1 refills | Status: DC

## 2016-10-09 MED ORDER — IBUPROFEN 600 MG PO TABS: 600.0000 mg | ORAL_TABLET | Freq: Four times a day (QID) | ORAL | 1 refills | Status: DC

## 2016-10-09 NOTE — Discharge Instructions (Signed)
Etonogestrel implant °What is this medicine? °ETONOGESTREL (et oh noe JES trel) is a contraceptive (birth control) device. It is used to prevent pregnancy. It can be used for up to 3 years. °COMMON BRAND NAME(S): Implanon, Nexplanon °What should I tell my health care provider before I take this medicine? °They need to know if you have any of these conditions: °-abnormal vaginal bleeding °-blood vessel disease or blood clots °-cancer of the breast, cervix, or liver °-depression °-diabetes °-gallbladder disease °-headaches °-heart disease or recent heart attack °-high blood pressure °-high cholesterol °-kidney disease °-liver disease °-renal disease °-seizures °-tobacco smoker °-an unusual or allergic reaction to etonogestrel, other hormones, anesthetics or antiseptics, medicines, foods, dyes, or preservatives °-pregnant or trying to get pregnant °-breast-feeding °How should I use this medicine? °This device is inserted just under the skin on the inner side of your upper arm by a health care professional. °Talk to your pediatrician regarding the use of this medicine in children. Special care may be needed. °What if I miss a dose? °This does not apply. °What may interact with this medicine? °Do not take this medicine with any of the following medications: °-amprenavir °-bosentan °-fosamprenavir °This medicine may also interact with the following medications: °-barbiturate medicines for inducing sleep or treating seizures °-certain medicines for fungal infections like ketoconazole and itraconazole °-griseofulvin °-medicines to treat seizures like carbamazepine, felbamate, oxcarbazepine, phenytoin, topiramate °-modafinil °-phenylbutazone °-rifampin °-some medicines to treat HIV infection like atazanavir, indinavir, lopinavir, nelfinavir, tipranavir, ritonavir °-St. John's wort °What should I watch for while using this medicine? °This product does not protect you against HIV infection (AIDS) or other sexually transmitted  diseases. °You should be able to feel the implant by pressing your fingertips over the skin where it was inserted. Contact your doctor if you cannot feel the implant, and use a non-hormonal birth control method (such as condoms) until your doctor confirms that the implant is in place. If you feel that the implant may have broken or become bent while in your arm, contact your healthcare provider. °What side effects may I notice from receiving this medicine? °Side effects that you should report to your doctor or health care professional as soon as possible: °-allergic reactions like skin rash, itching or hives, swelling of the face, lips, or tongue °-breast lumps °-changes in emotions or moods °-depressed mood °-heavy or prolonged menstrual bleeding °-pain, irritation, swelling, or bruising at the insertion site °-scar at site of insertion °-signs of infection at the insertion site such as fever, and skin redness, pain or discharge °-signs of pregnancy °-signs and symptoms of a blood clot such as breathing problems; changes in vision; chest pain; severe, sudden headache; pain, swelling, warmth in the leg; trouble speaking; sudden numbness or weakness of the face, arm or leg °-signs and symptoms of liver injury like dark yellow or brown urine; general ill feeling or flu-like symptoms; light-colored stools; loss of appetite; nausea; right upper belly pain; unusually weak or tired; yellowing of the eyes or skin °-unusual vaginal bleeding, discharge °-signs and symptoms of a stroke like changes in vision; confusion; trouble speaking or understanding; severe headaches; sudden numbness or weakness of the face, arm or leg; trouble walking; dizziness; loss of balance or coordination °Side effects that usually do not require medical attention (report to your doctor or health care professional if they continue or are bothersome): °-acne °-back pain °-breast pain °-changes in weight °-dizziness °-general ill feeling or flu-like  symptoms °-headache °-irregular menstrual bleeding °-nausea °-sore throat °-vaginal   irritation or inflammation Where should I keep my medicine? This drug is given in a hospital or clinic and will not be stored at home.  2017 Elsevier/Gold Standard (2015-11-11 10:56:20) Intrauterine Device Information Introduction An intrauterine device (IUD) is a medical device that gets inserted into the uterus to prevent pregnancy. It is a small, T-shaped device that has one or two nylon strings hanging down from it. The strings hang out of the lower part of the uterus (cervix) to allow for future IUD removal. There are two types of IUDs available:  Copper IUD. This type of IUD has copper wire wrapped around it. A copper IUD may last up to 10 years.  Hormone IUD. This type of IUD is made of plastic and contains the hormone progestin (synthetic progesterone). A hormone IUD may last 3 to 5 years. IUDs are inserted through the vagina and placed into the uterus with a minor medical procedure. How does the IUD work? Copper in the copper IUD prevents pregnancy by making the uterus and fallopian tubes produce a fluid that kills sperm. Synthetic progesterone in hormonal IUD prevents pregnancy by:  Thickening cervical mucus to prevent sperm from entering the uterus.  Thinning the uterine lining to prevent implantation of a fertilized egg.  Weakening or killing sperm that get into the uterus. What are the advantages of an IUD?  IUDs are highly effective, reversible, long-acting, and low-maintenance.  There are no estrogen-related side effects.  An IUD can be used when breastfeeding.  IUDs are not associated with weight gain.  Advantages of the copper IUD are that:  It works immediately after insertion.  It does not interfere with your body's natural hormones.  It can be used for 10 years.  Advantages of the hormone IUD are that:  If it is inserted within 7 days of your period starting, it works  immediately after insertion. If the hormone IUD is inserted at any other time in your cycle, you will need to use a backup method of birth control for 7 days after insertion.  It can make menstrual periods lighter.  It can decrease menstrual cramping.  It can be used for 3 or 5 years. What are the disadvantages of an IUD?  The hormone IUD may cause irregular menstrual bleeding for a period of time after insertion.  The copper IUD can make your menstrual flow heavier and more painful.  You may experience some pain during insertion, and cramping and vaginal bleeding after insertion. How is the IUD removed? Is the IUD right for me?  Your health care provider will make sure you are a good candidate for an IUD and will discuss side effects with you. This information is not intended to replace advice given to you by your health care provider. Make sure you discuss any questions you have with your health care provider. Document Released: 09/12/2004 Document Revised: 03/16/2016 Document Reviewed: 03/30/2013  2017 Elsevier Postpartum Depression and Baby Blues The postpartum period begins right after the birth of a baby. During this time, there is often a great amount of joy and excitement. It is also a time of many changes in the life of the parents. Regardless of how many times a mother gives birth, each child brings new challenges and dynamics to the family. It is not unusual to have feelings of excitement along with confusing shifts in moods, emotions, and thoughts. All mothers are at risk of developing postpartum depression or the "baby blues." These mood changes can occur right after  giving birth, or they may occur many months after giving birth. The baby blues or postpartum depression can be mild or severe. Additionally, postpartum depression can go away rather quickly, or it can be a long-term condition. What are the causes? Raised hormone levels and the rapid drop in those levels are thought to  be a main cause of postpartum depression and the baby blues. A number of hormones change during and after pregnancy. Estrogen and progesterone usually decrease right after the delivery of your baby. The levels of thyroid hormone and various cortisol steroids also rapidly drop. Other factors that play a role in these mood changes include major life events and genetics. What increases the risk? If you have any of the following risks for the baby blues or postpartum depression, know what symptoms to watch out for during the postpartum period. Risk factors that may increase the likelihood of getting the baby blues or postpartum depression include:  Having a personal or family history of depression.  Having depression while being pregnant.  Having premenstrual mood issues or mood issues related to oral contraceptives.  Having a lot of life stress.  Having marital conflict.  Lacking a social support network.  Having a baby with special needs.  Having health problems, such as diabetes. What are the signs or symptoms? Symptoms of baby blues include:  Brief changes in mood, such as going from extreme happiness to sadness.  Decreased concentration.  Difficulty sleeping.  Crying spells, tearfulness.  Irritability.  Anxiety. Symptoms of postpartum depression typically begin within the first month after giving birth. These symptoms include:  Difficulty sleeping or excessive sleepiness.  Marked weight loss.  Agitation.  Feelings of worthlessness.  Lack of interest in activity or food. Postpartum psychosis is a very serious condition and can be dangerous. Fortunately, it is rare. Displaying any of the following symptoms is cause for immediate medical attention. Symptoms of postpartum psychosis include:  Hallucinations and delusions.  Bizarre or disorganized behavior.  Confusion or disorientation. How is this diagnosed? A diagnosis is made by an evaluation of your symptoms. There  are no medical or lab tests that lead to a diagnosis, but there are various questionnaires that a health care provider may use to identify those with the baby blues, postpartum depression, or psychosis. Often, a screening tool called the [place] Postnatal Depression Scale is used to diagnose depression in the postpartum period. How is this treated? The baby blues usually goes away on its own in 1-2 weeks. Social support is often all that is needed. You will be encouraged to get adequate sleep and rest. Occasionally, you may be given medicines to help you sleep. Postpartum depression requires treatment because it can last several months or longer if it is not treated. Treatment may include individual or group therapy, medicine, or both to address any social, physiological, and psychological factors that may play a role in the depression. Regular exercise, a healthy diet, rest, and social support may also be strongly recommended. Postpartum psychosis is more serious and needs treatment right away. Hospitalization is often needed. Follow these instructions at home:  Get as much rest as you can. Nap when the baby sleeps.  Exercise regularly. Some women find yoga and walking to be beneficial.  Eat a balanced and nourishing diet.  Do little things that you enjoy. Have a cup of tea, take a bubble bath, read your favorite magazine, or listen to your favorite music.  Avoid alcohol.  Ask for help with household chores, cooking,  grocery shopping, or running errands as needed. Do not try to do everything.  Talk to people close to you about how you are feeling. Get support from your partner, family members, friends, or other new moms.  Try to stay positive in how you think. Think about the things you are grateful for.  Do not spend a lot of time alone.  Only take over-the-counter or prescription medicine as directed by your health care provider.  Keep all your postpartum appointments.  Let your  health care provider know if you have any concerns. Contact a health care provider if: You are having a reaction to or problems with your medicine. Get help right away if:  You have suicidal feelings.  You think you may harm the baby or someone else. This information is not intended to replace advice given to you by your health care provider. Make sure you discuss any questions you have with your health care provider. Document Released: 07/13/2004 Document Revised: 03/16/2016 Document Reviewed: 07/21/2013 Elsevier Interactive Patient Education  2017 Elsevier Inc. Postpartum Care After Cesarean Delivery The period of time right after you deliver your newborn is called the postpartum period. What kind of medical care will I receive?  You may continue to receive fluids and medicines through an IV tube inserted into one of your veins.  You may have small, flexible tube (catheter) draining urine from your bladder into a bag outside of your body. The catheter will be removed as soon as possible.  You may be given a squirt bottle to use when you go to the bathroom. You may use this until you are comfortable wiping as usual. To use the squirt bottle, follow these steps:  Before you urinate, fill the squirt bottle with warm water. The water should be warm. Do not use hot water.  After you urinate, while you are sitting on the toilet, use the squirt bottle to rinse the area around your urethra and vaginal opening. This rinses away any urine and blood.  You may do this instead of wiping. As you start healing, you may use the squirt bottle before wiping yourself. Make sure to wipe gently.  Fill the squirt bottle with clean water every time you use the bathroom.  You will be given sanitary pads to wear.  Your incision will be monitored to make sure it is healing properly. You will be told when it is safe for your stitches, staples, or skin adhesive tape to be removed. What can I expect?  You  may not feel the need to urinate for several hours after delivery.  You will have some soreness and pain in your abdomen. You may have a small amount of blood or clear fluid coming from your incision.  If you are breastfeeding, you may have uterine contractions every time you breastfeed for up to several weeks postpartum. Uterine contractions help your uterus return to its normal size.  It is normal to have vaginal bleeding (lochia) after delivery. The amount and appearance of lochia is often similar to a menstrual period in the first week after delivery. It will gradually decrease over the next few weeks to a dry, yellow-brown discharge. For most women, lochia stops completely by 6-8 weeks after delivery. Vaginal bleeding can vary from woman to woman.  Within the first few days after delivery, you may have breast engorgement. This is when your breasts feel heavy, full, and uncomfortable. Your breasts may also throb and feel hard, tightly stretched, warm, and tender. After  this occurs, you may have milk leaking from your breasts.Your health care provider can help you relieve discomfort due to breast engorgement. Breast engorgement should go away within a few days.  You may feel more sad or worried than normal due to hormonal changes after delivery. These feelings should not last more than a few days. If these feelings do not go away after several days, speak with your health care provider. How should I care for myself?  Tell your health care provider if you have pain or discomfort.  Drink enough water to keep your urine clear or pale yellow.  Wash your hands thoroughly with soap and water for at least 20 seconds after changing your sanitary pads or using the toilet, and before holding or feeding your baby.  If you are not breastfeeding, avoid touching your breasts a lot. Doing this can make your breasts produce more milk.  If you become weak or lightheaded, or you feel like you might faint, ask  for help before:  Getting out of bed.  Showering.  Change your sanitary pads frequently. Watch for any changes in your flow, such as a sudden increase in volume, a change in color, or the passing of large blood clots. If you pass a blood clot from your vagina, save it to show to your health care provider. Do not flush blood clots down the toilet without having your health care provider look at them.  Make sure that all your vaccinations are up to date. This can help protect you and your baby from getting certain diseases. You may need to have immunizations done before you leave the hospital.  If desired, talk with your health care provider about methods of family planning or birth control (contraception). How can I start bonding with my baby? Spending as much time as possible with your baby is very important. During this time, you and your baby can get to know each other and develop a bond. Having your baby stay with you in your room (rooming in) can give you time to get to know your baby. Rooming in can also help you become comfortable caring for your baby. Breastfeeding can also help you bond with your baby. How can I plan for returning home with my baby?  Make sure that you have a car seat installed in your vehicle.  Your car seat should be checked by a certified car seat installer to make sure that it is installed safely.  Make sure that your baby fits into the car seat safely.  Ask your health care provider any questions you have about caring for yourself or your baby. Make sure that you are able to contact your health care provider with any questions after leaving the hospital. This information is not intended to replace advice given to you by your health care provider. Make sure you discuss any questions you have with your health care provider. Document Released: 07/03/2012 Document Revised: 03/13/2016 Document Reviewed: 09/13/2015 Elsevier Interactive Patient Education  2017 [employer]

## 2016-10-09 NOTE — Discharge Summary (Signed)
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 yo G1P1001 at 4580w1d was admitted to the hospital 10/06/2016 for scheduled cesarean section with the following indication:Active HSV and Elective Primary.  Membrane Rupture Time/Date: 6:15 PM ,10/06/2016   Patient delivered a Viable infant.10/06/2016  Details of operation can be found in separate operative note.  Pateint had an uncomplicated postpartum course.  She is ambulating, tolerating a regular diet, passing flatus, and urinating well. Patient is discharged home in stable condition on  10/09/16          Physical exam Vitals:   10/07/16 1210 10/07/16 1602 10/08/16 0621 10/08/16 1800  BP: (!) 96/56 (!) 106/52 (!) 112/58 (!) 100/48  Pulse: 64  64 60  Resp: 18 18 20 20   Temp: 97.8 F (36.6 C) 98.4 F (36.9 C) 97.7 F (36.5 C) 98.6 F (37 C)  TempSrc:  Oral Oral Oral  SpO2: 100% 100% 100% 100%  Weight:      Height:       General: alert, cooperative and no distress fatigued Chest: HRRR, Lungs CTA Skin: Warm Dry Lochia: appropriate Uterine Fundus: firm, U/-2 Incision: Dressing is clean, dry, and intact DVT Evaluation: No significant calf/ankle edema. Labs: Lab Results  Component Value Date   WBC 9.0 10/07/2016   HGB 11.0 (L) 10/07/2016   HCT 31.6 (L) 10/07/2016   MCV 85.9 10/07/2016   PLT 174  10/07/2016   CMP Latest Ref Rng & Units 04/08/2016  Glucose 65 - 99 mg/dL 409(W100(H)  BUN 6 - 20 mg/dL 9  Creatinine 1.190.44 - 1.471.00 mg/dL 8.290.64  Sodium 562135 - 130145 mmol/L 134(L)  Potassium 3.5 - 5.1 mmol/L 3.8  Chloride 101 - 111 mmol/L 102  CO2 22 - 32 mmol/L 23  Calcium 8.9 - 10.3 mg/dL 9.4  Total Protein 6.5 - 8.1 g/dL 6.8  Total Bilirubin 0.3 - 1.2 mg/dL 0.3  Alkaline Phos 38 - 126 U/L 50  AST 15 - 41 U/L 24  ALT 14 - 54 U/L 10(L)    Discharge instruction: per After Visit Summary and "Baby and Me Booklet". Remove steri-strips in 7-10 days.  Incision care guidelines: how to clean, when to call, and anticipated healing.  Pain Management, Peri-Care, Breastfeeding, Who and When to call for postpartum complications. Information Sheet(s) given PP BB and Depression, Care after C/S, IUD/Nexplanon  After visit meds:  Allergies as of 10/09/2016      Reactions  Monistat [miconazole] Swelling, Rash      Medication List    TAKE these medications   acyclovir 400 MG tablet Commonly known as:  ZOVIRAX Take 400 mg by mouth every 8 (eight) hours.   ibuprofen 600 MG tablet Commonly known as:  ADVIL,MOTRIN Take 1 tablet (600 mg total) by mouth every 6 (six) hours.   oxyCODONE-acetaminophen 5-325 MG tablet Commonly known as:  PERCOCET/ROXICET Take 1-2 tablets by mouth every 6 (six) hours as needed (pain scale > 7).   prenatal multivitamin Tabs tablet Take 1 tablet by mouth daily at 12 noon.   senna-docusate 8.6-50 MG tablet Commonly known as:  Senokot-S Take 2 tablets by mouth daily. For 7 days, then as needed Start taking on:  10/10/2016       Diet: routine diet  Activity: Advance as tolerated. Pelvic rest for 6 weeks.   Outpatient follow up:6 weeks Follow up Appt:No future appointments. Follow up Visit:No Follow-up on file.  Postpartum contraception: Progesterone only pills and Undecided  Newborn Data: Live born female  Birth Weight: 7 lb 0.7 oz (3195 g) APGAR: 8, 9  Baby  Feeding: Breast Disposition:home with mother   10/09/2016 Tanya Pierce, CNM

## 2016-10-09 NOTE — Lactation Note (Signed)
This note was copied from a baby's chart. Lactation Consultation Note  Patient Name: Tanya Pierce [id number]'IToday's Date: 10/09/2016 Reason for consult: Follow-up assessment   With this mom and term baby, now 2562 hours old. Mom is tired from cluster feeding. I explained that the first two weeks are the most difficult, but as her milk continues to transition in, he will be more content. Breast care/engorgement reviewed.Mom has baby latched without pillow support, in cradle hold. I showed her how to latch in cross cradle, and keep baby STS from one breast to the other, and use pillows for support. Mom could feel this latch was deeper and more comfortable. I told mom a deep latch will protect her nipples and the baby will get more to eat. Mom knows to call for questions/conerns.    Maternal Data    Feeding Feeding Type: Breast Fed Length of feed: 20 min  LATCH Score/Interventions                      Lactation Tools Discussed/Used     Consult Status Consult Status: Complete Follow-up type: Call as needed    Alfred LevinsLee, Abdullah Rizzi Anne 10/09/2016, 8:23 AM

## 2016-10-09 NOTE — Progress Notes (Signed)
Pt had breakthrough heartburn at a 7/10. I called [facility] on-call MD Dr. Su Hiltoberts, who gave a verbal order for 20mg  Pepcid and to reassess before discharging pt.

## 2016-10-09 NOTE — Clinical Social Work Maternal (Signed)
  CLINICAL SOCIAL WORK MATERNAL/CHILD NOTE  Patient Details  Name: Tanya Pierce MRN: [id number] Date of Birth: 1995-11-18  Date:  10/09/2016  Clinical Social Worker Initiating Note:  Laurey Arrow Date/ Time Initiated:  10/09/16/1050     Child's Name:  Tanya Pierce   Legal Guardian:  Mother   Need for Interpreter:  None   Date of Referral:  10/09/16     Reason for Referral:  Other (Comment) (Relationship concerns with FOB)   Referral Source:  Central Nursery   Address:  [address] [place] [address]  Phone number:  [phone]   Household Members:  Self, Relatives (Grandmother of MOB)   Natural Supports (not living in the home):  Parent, Spouse/significant other, Friends, Extended Family, Immediate Family   Professional Supports: None   Employment: Ship broker   Type of Work:     Education:  Diplomatic Services operational officer Resources:  Multimedia programmer   Other Resources:      Cultural/Religious Considerations Which May Impact Care:  None Reported   Strengths:  Ability to meet basic needs , Pediatrician chosen , Home prepared for child    Risk Factors/Current Problems:  Family/Relationship Issues    Cognitive State:  Alert , Able to Concentrate , Linear Thinking , Goal Oriented    Mood/Affect:  Depressed , Sad , Interested    CSW Assessment: CSW met with MOB to complete an assessment for relationship concerns with MOB and FOB (Tanya Pierce 05/03/1995).  When CSW arrived, MOB was resting in bed, FOB was resting on sofa, and infant was asleep in bassinet. With MOB's permission, CSW asked FOB to leave room in effort to meet with MOB in private. MOB was inviting and receptive to meeting with MOB.  CSW inquired about MOB's relationship with FOB.  MOB reported that MOB's pregnancy was not planned and that the relationship with FOB has been complicated due to FOB being in a relationship with another young woman that is currently pregnant. MOB  became tearful while explaining to CSW that MOB wants FOB to be present in infant's life but is sad that FOB is in a relationship with another young woman. MOB also expressed feelings of sadness about MOB being exposed genital herpes during pregnancy.   CSW normalized MOB's thoughts and feelings and encouraged MOB to communicated MOB's thoughts and feelings with FOB. MOB expressed feelings of being overwhelmed about being a new mother, but also expressed feelings of happiness.  MOB communicated that MOB has a wealth of supports from immediate to extended family members.  MOB denied DV and CSW offered MOB resources for outpatient therapy; MOB declined. CSW educated MOB about PPD. CSW informed MOB of possible supports and interventions to decrease PPD.  CSW also encouraged MOB to seek medical attention if needed for increased signs and symptoms for PPD. CSW provided MOB with CSW contact information and encouraged MOB to contact CSW if MOB had any additional questions or concerns.  CSW Plan/Description:  Information/Referral to [employer] , [employer] , No Further Intervention Required/No Barriers to Discharge    Dimple Nanas, LCSW 10/09/2016, 10:57 AM

## 2016-10-09 NOTE — Progress Notes (Signed)
Reassessed pt, medicine and pt eating lunch reduced heartburn.  Dr. Su Hiltoberts comfortable with pt discharging, told to tell patient to get OTC heartburn medicine on the way home.

## 2016-10-14 ENCOUNTER — Encounter (HOSPITAL_BASED_OUTPATIENT_CLINIC_OR_DEPARTMENT_OTHER): Payer: Self-pay | Admitting: Emergency Medicine

## 2016-10-14 ENCOUNTER — Emergency Department (HOSPITAL_BASED_OUTPATIENT_CLINIC_OR_DEPARTMENT_OTHER)
Admission: EM | Admit: 2016-10-14 | Discharge: 2016-10-15 | Disposition: A | Discharge status: Home / Self Care | Payer: BLUE CROSS/BLUE SHIELD | Attending: Emergency Medicine | Admitting: Emergency Medicine

## 2016-10-14 DIAGNOSIS — Z79899 Other long term (current) drug therapy: Secondary | ICD-10-CM | POA: Diagnosis not present

## 2016-10-14 DIAGNOSIS — R079 Chest pain, unspecified: Secondary | ICD-10-CM | POA: Diagnosis not present

## 2016-10-14 DIAGNOSIS — R200 Anesthesia of skin: Secondary | ICD-10-CM | POA: Diagnosis present

## 2016-10-14 DIAGNOSIS — N3000 Acute cystitis without hematuria: Secondary | ICD-10-CM | POA: Insufficient documentation

## 2016-10-14 DIAGNOSIS — M7989 Other specified soft tissue disorders: Secondary | ICD-10-CM | POA: Diagnosis not present

## 2016-10-14 NOTE — ED Notes (Signed)
Pt reports intermittent sharp pains in central chest along with aching breasts. Pt reports breast engorgement.

## 2016-10-14 NOTE — ED Triage Notes (Signed)
Patient states that she noticed about an hour ago that she is having numbness to her bilateral feet and swelling. Patient had a c - section about 1 week ago

## 2016-10-14 NOTE — ED Provider Notes (Signed)
[facility]   CSN: [id number] Arrival date & time: 10/14/16  2309  By signing my name below, I, Tanya Pierce, attest that this documentation has been prepared under the direction and in the presence of Tanya Baton, MD . Electronically Signed: Modena Pierce, Scribe. 10/14/2016. 11:50 PM.  History   Chief Complaint Chief Complaint  Patient presents with  . Weakness   The history is provided by the patient. No language interpreter was used.   HPI Comments: Tanya Pierce is a 20 y.o. female who presents to the Emergency Department complaining of constant BLE numbness and swelling noted about an hour ago. She states she noticed numbness and swelling to her bilateral feet after ambulating. 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 Patient reports urinary frequency and pain with urination. No increasing abdominal pain. She is taking pain medication at home following her C-section. She continues to have some vaginal bleeding with no increase in vaginal bleeding denies significant swelling while in the hospital. Patient also reports chest pain. Has had chest pain since discharge. Somewhat worse with certain positions. Denies cough or fever. Denies shortness of breath. She states no modifying factors. She denies any other complaints.     PCP: [name], MD  Past Medical History:  Diagnosis Date  . Allergy   . Anemia   . HSV (herpes simplex virus) anogenital infection   . Ingrown toenail     Patient Active Problem List   Diagnosis Date Noted  . Delivered by cesarean section 10/06/2016  . HSV-2 infection complicating pregnancy, third trimester 10/05/2016  . Onychomycosis due to dermatophyte 01/13/2013    Past Surgical History:  Procedure Laterality Date  . ADENOIDECTOMY  1998  . CESAREAN SECTION N/A 10/06/2016   Procedure: CESAREAN SECTION;  Surgeon: Hoover Browns, MD;  [facility];  Service:  Obstetrics;  Laterality: N/A;  . EYE SURGERY  2002   eye muscle  . MYRINGOTOMY  1998    OB History    Gravida Para Term Preterm AB Living   1 1 1     1    SAB TAB Ectopic Multiple Live Births         0 1       Home Medications    Prior to Admission medications   Medication Sig Start Date End Date Taking? Authorizing Provider  gabapentin (NEURONTIN) 100 MG capsule Take 100 mg by mouth 3 (three) times daily.   Yes Historical Provider, MD  acyclovir (ZOVIRAX) 400 MG tablet Take 400 mg by mouth every 8 (eight) hours.    Historical Provider, MD  cephALEXin (KEFLEX) 500 MG capsule Take 1 capsule (500 mg total) by mouth 3 (three) times daily. 10/15/16   Tanya Baton, MD  ibuprofen (ADVIL,MOTRIN) 600 MG tablet Take 1 tablet (600 mg total) by mouth every 6 (six) hours. 10/09/16   Gerrit Heck, CNM  oxyCODONE-acetaminophen (PERCOCET/ROXICET) 5-325 MG tablet Take 1-2 tablets by mouth every 6 (six) hours as needed (pain scale > 7). 10/09/16   Gerrit Heck, CNM  Prenatal Vit-Fe Fumarate-FA (PRENATAL MULTIVITAMIN) TABS tablet Take 1 tablet by mouth daily at 12 noon.    Historical Provider, MD  senna-docusate (SENOKOT-S) 8.6-50 MG tablet Take 2 tablets by mouth daily. For 7 days, then as needed 10/10/16   Gerrit Heck, CNM    Family History Family History  Problem Relation Age of Onset  . Hyperlipidemia Mother   .  Hypertension Father   . Hyperlipidemia Maternal Grandmother   . Cancer Maternal Grandfather     throat and prostate  . Diabetes Paternal Grandmother   . Diabetes Paternal Grandfather   . Heart disease Paternal Grandfather     Social History Social History  Substance Use Topics  . Smoking status: Never Smoker  . Smokeless tobacco: Never Used  . Alcohol use No     Comment: none with preg     Allergies   Monistat [miconazole]   Review of Systems Review of Systems  Constitutional: Negative for fever.  Respiratory: Positive for chest tightness. Negative for  shortness of breath.   Cardiovascular: Positive for chest pain and leg swelling.  Gastrointestinal: Positive for abdominal pain (Mild).  Genitourinary: Positive for vaginal bleeding.  Neurological: Positive for weakness and numbness.  All other systems reviewed and are negative.    Physical Exam Updated Vital Signs BP 123/55 (BP Location: Right Arm)   Pulse (!) 58   Temp 97.9 F (36.6 C) (Oral)   Resp 18   Wt 145 lb (65.8 kg)   LMP 01/06/2016   SpO2 100%   BMI 24.89 kg/m   Physical Exam  Constitutional: She is oriented to person, place, and time. She appears well-developed and well-nourished. No distress.  HENT:  Head: Normocephalic and atraumatic.  Cardiovascular: Normal rate, regular rhythm and normal heart sounds.   No murmur heard. Pulmonary/Chest: Effort normal and breath sounds normal. No respiratory distress. She has no wheezes. She exhibits tenderness.  Bilateral breast engorgement  Abdominal: Soft. Bowel sounds are normal. There is no tenderness. There is no guarding.  C-section incision clean dry and intact  Musculoskeletal:  1+ bilateral lower extremity edema feet, no calf swelling or tenderness  Neurological: She is alert and oriented to person, place, and time.  Skin: Skin is warm and dry.  Psychiatric: She has a normal mood and affect.  Nursing note and vitals reviewed.    ED Treatments / Results  DIAGNOSTIC STUDIES: Oxygen Saturation is 100% on RA, normal by my interpretation.    COORDINATION OF CARE: 11:54 PM- Pt advised of plan for treatment and pt agrees.  Labs (all labs ordered are listed, but only abnormal results are displayed) Labs Reviewed  URINALYSIS, ROUTINE W REFLEX MICROSCOPIC - Abnormal; Notable for the following:       Result Value   APPearance CLOUDY (*)    Hgb urine dipstick LARGE (*)    Protein, ur 30 (*)    Leukocytes, UA LARGE (*)    All other components within normal limits  URINALYSIS, MICROSCOPIC (REFLEX) - Abnormal;  Notable for the following:    Bacteria, UA MANY (*)    Squamous Epithelial / LPF 6-30 (*)    All other components within normal limits  URINE CULTURE    EKG  EKG Interpretation  Date/Time:  Sunday October 15 2016 00:15:09 EST Ventricular Rate:  56 PR Interval:    QRS Duration: 80 QT Interval:  426 QTC Calculation: 412 R Axis:   60 Text Interpretation:  Sinus rhythm Confirmed by Wilkie AyeHORTON  MD, Marquail Bradwell ([id number]) on 10/15/2016 1:37:08 AM       Radiology Dg Chest 2 View  Result Date: 10/15/2016 CLINICAL DATA:  Chest tightness for 6 days. Lower extremity paresthesias for 2 days. EXAM: CHEST  2 VIEW COMPARISON:  None. FINDINGS: The heart size and mediastinal contours are within normal limits. Both lungs are clear. The visualized skeletal structures are unremarkable. IMPRESSION: No active cardiopulmonary disease. Electronically  Signed   By: Ellery Plunkaniel R Mitchell M.D.   On: 10/15/2016 01:48    Procedures Procedures (including critical care time)  Medications Ordered in ED Medications  cefTRIAXone (ROCEPHIN) injection 1 g (not administered)     Initial Impression / Assessment and Plan / ED Course  I have reviewed the triage vital signs and the nursing notes.  Pertinent labs & imaging results that were available during my care of the patient were reviewed by me and considered in my medical decision making (see chart for details).  Clinical Course    Patient presents with lower charity swelling. Also reports urinary symptoms and persistent chest pain chest discharge. She is nontoxic. Vital signs reassuring. Exam is largely benign. Chest x-ray and EKG reassuring.  She did recently have surgery but she is otherwise PERC neg and doubt PE.  Given description of pain, suspect atelectasis postsurgical. Urine does show evidence of a UTI. Regarding swelling it is bilateral and symmetric. Suspect postop third spacing. Patient given Rocephin. Discharge him with antibiotics. OB follow-up. She was  given strict return precautions.  After history, exam, and medical workup I feel the patient has been appropriately medically screened and is safe for discharge home. Pertinent diagnoses were discussed with the patient. Patient was given return precautions.  Final Clinical Impressions(s) / ED Diagnoses   Final diagnoses:  Swelling of lower extremity  Acute cystitis without hematuria  Chest pain, unspecified type    New Prescriptions New Prescriptions   CEPHALEXIN (KEFLEX) 500 MG CAPSULE    Take 1 capsule (500 mg total) by mouth 3 (three) times daily.   I personally performed the services described in this documentation, which was scribed in my presence. The recorded information has been reviewed and is accurate.     Tanya Batonourtney F Jasyah Theurer, MD 10/15/16 [phone]

## 2016-10-15 ENCOUNTER — Emergency Department (HOSPITAL_BASED_OUTPATIENT_CLINIC_OR_DEPARTMENT_OTHER): Payer: BLUE CROSS/BLUE SHIELD

## 2016-10-15 ENCOUNTER — Other Ambulatory Visit: Payer: Self-pay

## 2016-10-15 LAB — URINALYSIS, ROUTINE W REFLEX MICROSCOPIC
Bilirubin Urine: NEGATIVE
Glucose, UA: NEGATIVE mg/dL
Ketones, ur: NEGATIVE mg/dL
Nitrite: NEGATIVE
PROTEIN: 30 mg/dL — AB
SPECIFIC GRAVITY, URINE: 1.009 (ref 1.005–1.030)
pH: 5.5 (ref 5.0–8.0)

## 2016-10-15 LAB — URINALYSIS, MICROSCOPIC (REFLEX)

## 2016-10-15 MED ORDER — CEPHALEXIN 500 MG PO CAPS: 500.0000 mg | ORAL_CAPSULE | Freq: Three times a day (TID) | ORAL | 0 refills | Status: DC

## 2016-10-15 MED ORDER — LIDOCAINE HCL (PF) 1 % IJ SOLN
INTRAMUSCULAR | Status: AC
  Administered 2016-10-15: 2.1 mL
  Filled 2016-10-15: qty 5

## 2016-10-15 MED ORDER — CEFTRIAXONE SODIUM 1 G IJ SOLR
1.0000 g | Freq: Once | INTRAMUSCULAR | Status: AC
  Administered 2016-10-15: 1 g via INTRAMUSCULAR
  Filled 2016-10-15: qty 10

## 2016-10-15 NOTE — ED Notes (Signed)
Pt provided with breast pump to relieve engorgement symptoms. Per Dr. Wilkie AyeHorton it is okay to delay EKG until after pt is finished pumping.

## 2016-10-15 NOTE — ED Notes (Signed)
Pt given d/c instructions as per chart. rx x 1. Verbalizes understanding. No questions. 

## 2016-10-15 NOTE — Discharge Instructions (Signed)
You were seen today for swelling in your feet and urinary symptoms as well as chest pain. Your workup is reassuring. You do have evidence of urinary tract infection. Regarding her swelling, this is likely normal postoperatively. Keep her feet elevated. Regarding her chest pain, your chest x-ray is clearing her EKG is reassuring. If he continued to have symptoms she needs to be reevaluated by her OB/GYN. If you develop shortness of breath, fever, any new or worsening symptoms she needs to be reevaluated.

## 2016-10-16 LAB — URINE CULTURE

## 2018-07-17 ENCOUNTER — Encounter: Payer: Self-pay | Admitting: Gastroenterology

## 2018-08-27 ENCOUNTER — Ambulatory Visit: Payer: BLUE CROSS/BLUE SHIELD | Admitting: Gastroenterology

## 2018-10-23 IMAGING — US US MFM OB LIMITED
1 series · 15 of 24 positions shown · non-contrast
Comparison: none

[Series 1: us mfm ob limited · 24 acquisitions, 15 frames shown]
[im 1/24]
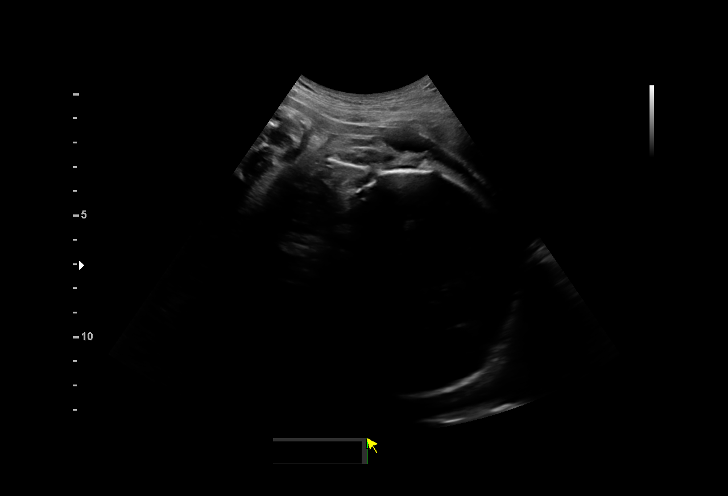
[im 3/24]
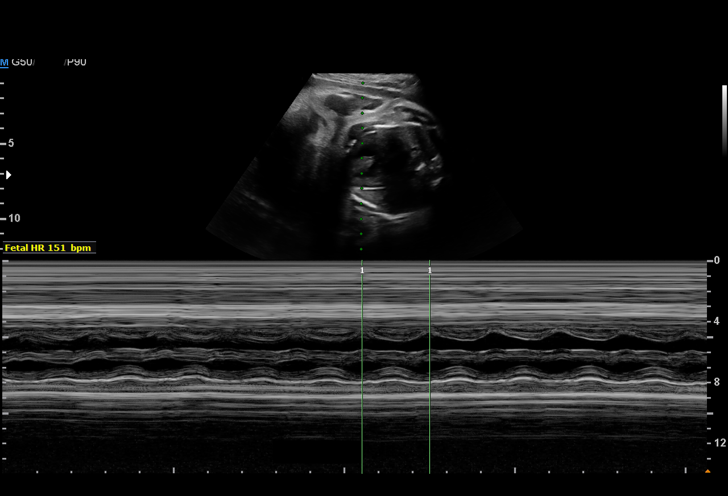
[im 5/24]
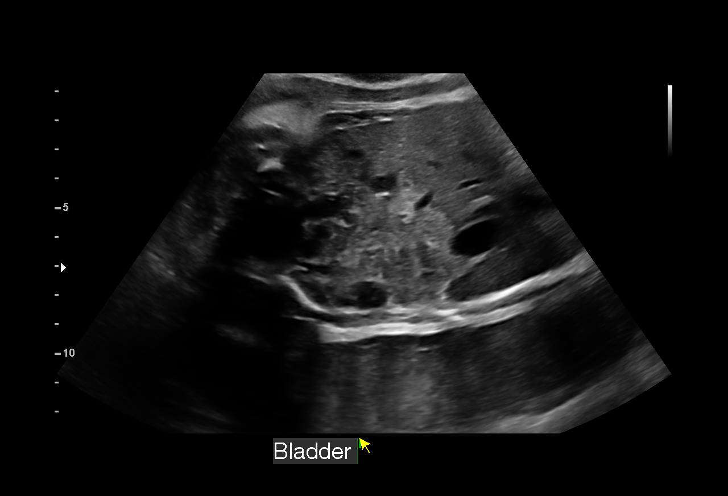
[im 6/24]
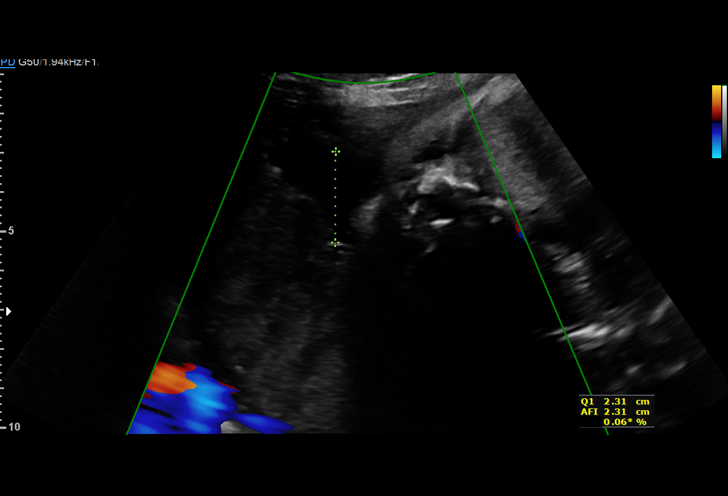
[im 8/24]
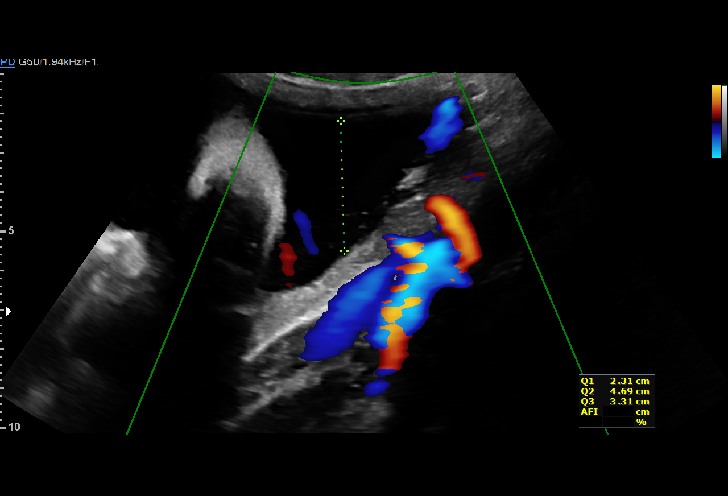
[im 9/24]
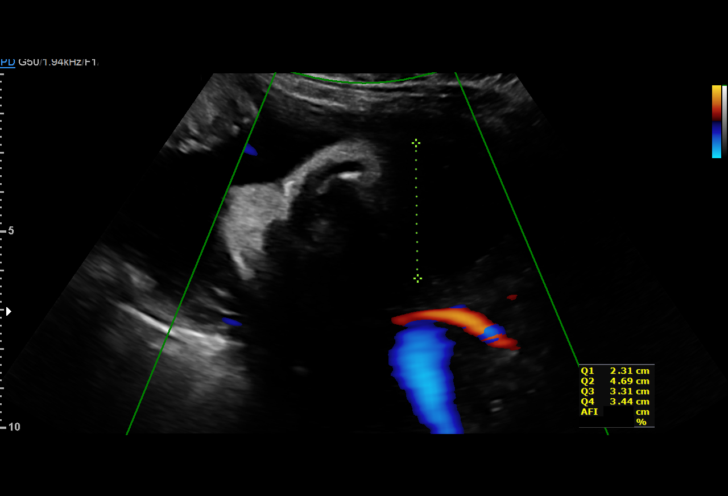
[im 11/24]
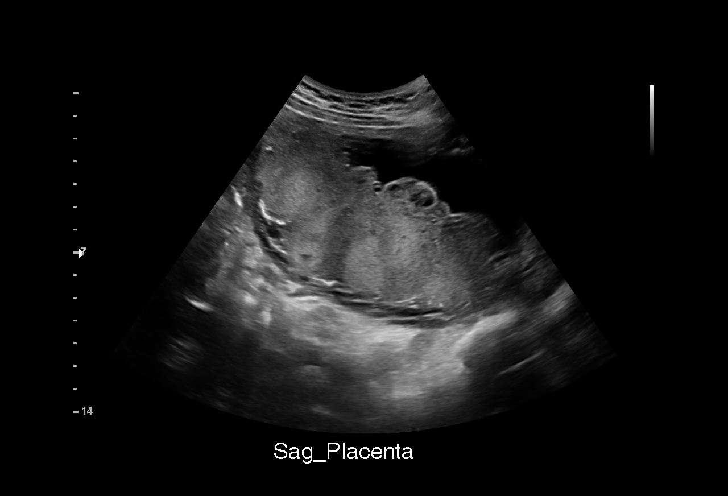
[im 13/24]
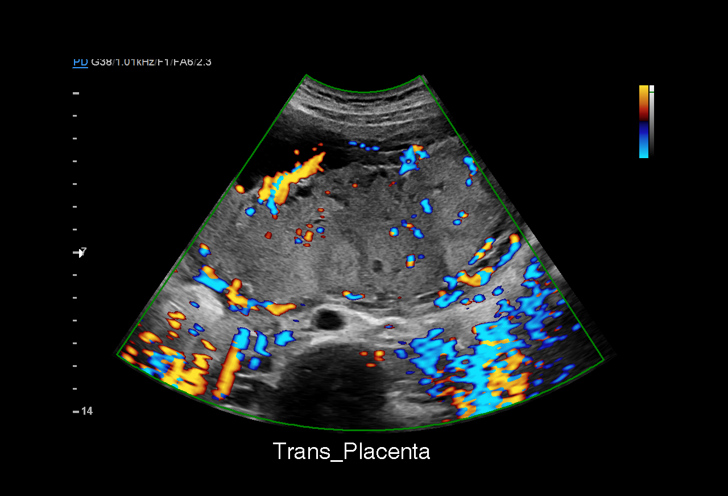
[im 14/24]
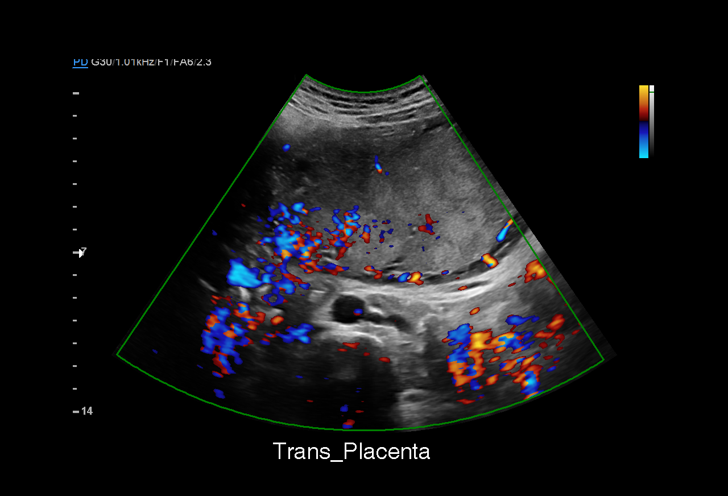
[im 16/24]
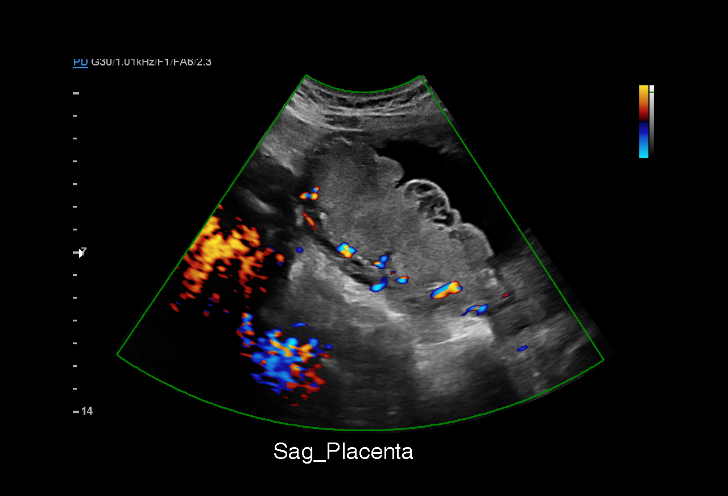
[im 17/24]
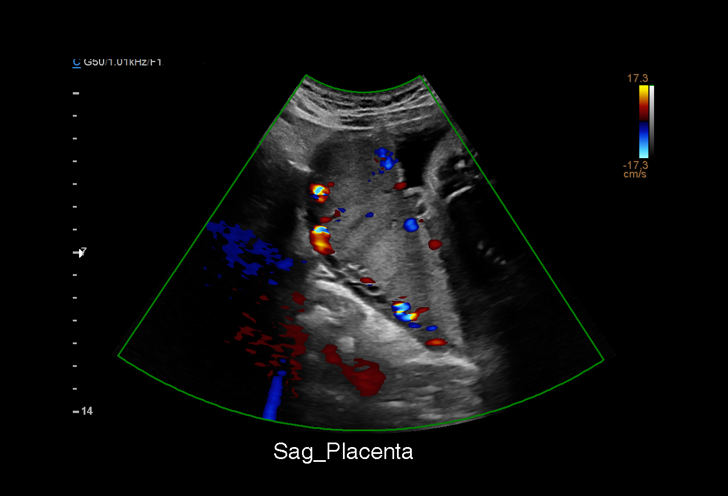
[im 19/24]
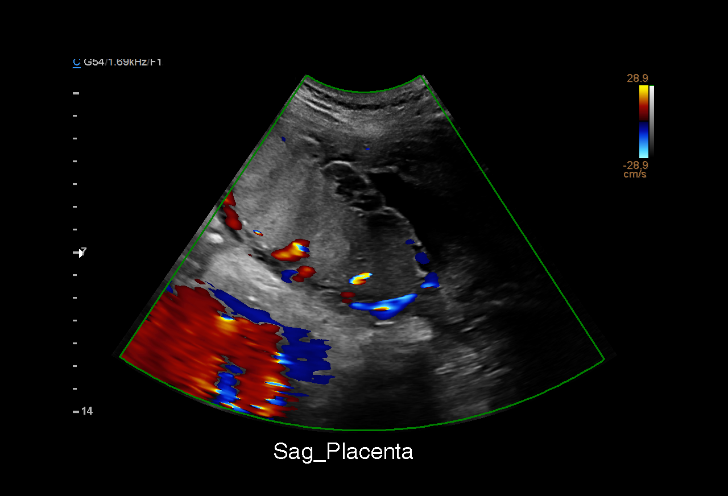
[im 21/24]
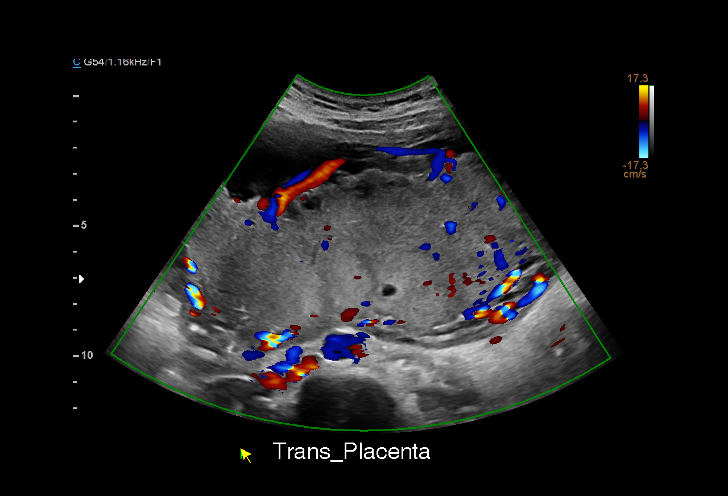
[im 22/24]
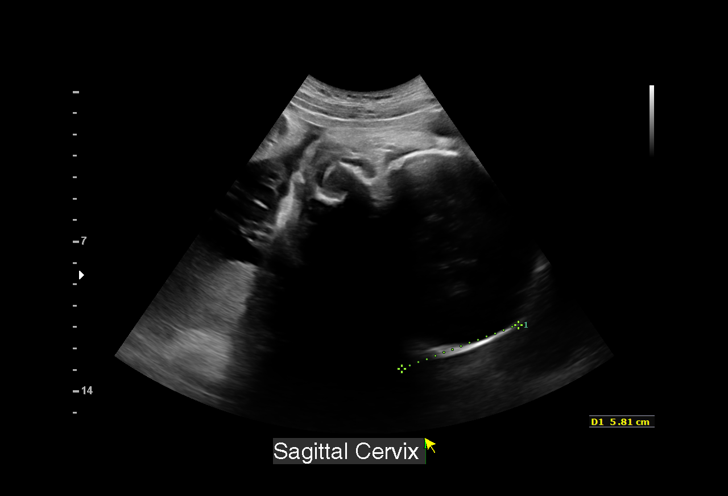
[im 24/24]
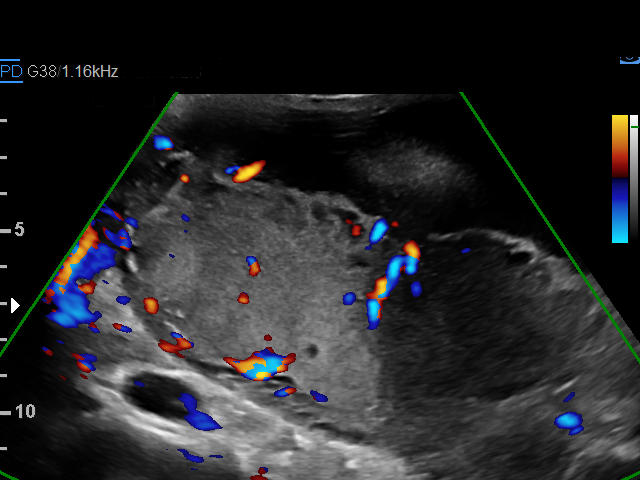

[15 of 24 positions shown; findings below may reference images not displayed]

MAU/Triage
NONA NONA NP

1  CLINTON WES          [id number]       [id number]     [id number]
Indications

33 weeks gestation of pregnancy
Vaginal bleeding in pregnancy, third
trimester (since 7272am, as much as a
period)
Abdominal pain in pregnancy (pressure with
VB)
OB History

Gravidity:    1         Term:   0        Prem:   0         SAB:   0
TOP:          0       Ectopic:  0        Living: 0
Fetal Evaluation

Num Of Fetuses:     1
Fetal Heart         151
Rate(bpm):
Cardiac Activity:   Observed
Presentation:       Cephalic
Placenta:           Posterior, above cervical os
P. Cord Insertion:  Visualized

Amniotic Fluid
AFI FV:      Subjectively within normal limits

AFI Sum(cm)     %Tile       Largest Pocket(cm)
13.75           46

RUQ(cm)       RLQ(cm)       LUQ(cm)        LLQ(cm)
2.31

Comment:    No placental abruption or previa identified.
Gestational Age

Clinical EDD:  33w 5d                                        EDD:    10/12/16
Best:          33w 5d     Det. By:  Clinical EDD             EDD:    10/12/16
Cervix Uterus Adnexa

Cervix
Not visualized (advanced GA >65wks)

Adnexa:       No abnormality visualized.
Impression

SIUP at 33+5 weeks
Normal amniotic fluid volume
Posterior placenta; no previa; no subchorionic fluid
collections/hemorrhage identified
Recommendations

Follow-up as clinically indicated

## 2019-01-02 ENCOUNTER — Other Ambulatory Visit: Payer: Self-pay

## 2019-08-12 ENCOUNTER — Other Ambulatory Visit: Payer: Self-pay

## 2019-08-12 DIAGNOSIS — Z20822 Contact with and (suspected) exposure to covid-19: Secondary | ICD-10-CM

## 2019-08-13 LAB — NOVEL CORONAVIRUS, NAA: SARS-CoV-2, NAA: DETECTED — AB

## 2019-11-28 ENCOUNTER — Other Ambulatory Visit: Payer: Self-pay | Admitting: Obstetrics & Gynecology

## 2020-05-11 ENCOUNTER — Other Ambulatory Visit: Payer: Self-pay

## 2020-05-11 ENCOUNTER — Emergency Department (HOSPITAL_BASED_OUTPATIENT_CLINIC_OR_DEPARTMENT_OTHER)
Admit: 2020-05-11 | Discharge: 2020-05-11 | Disposition: A | Discharge status: Home / Self Care | Payer: Managed Care, Other (non HMO) | Attending: Emergency Medicine | Admitting: Emergency Medicine

## 2020-05-11 ENCOUNTER — Encounter (HOSPITAL_BASED_OUTPATIENT_CLINIC_OR_DEPARTMENT_OTHER): Payer: Self-pay | Admitting: Emergency Medicine

## 2020-05-11 ENCOUNTER — Emergency Department (HOSPITAL_BASED_OUTPATIENT_CLINIC_OR_DEPARTMENT_OTHER)
Admission: EM | Admit: 2020-05-11 | Discharge: 2020-05-11 | Disposition: A | Discharge status: Home / Self Care | Payer: Managed Care, Other (non HMO) | Attending: Emergency Medicine | Admitting: Emergency Medicine

## 2020-05-11 DIAGNOSIS — M79661 Pain in right lower leg: Secondary | ICD-10-CM | POA: Insufficient documentation

## 2020-05-11 MED ORDER — RIVAROXABAN 15 MG PO TABS
15.0000 mg | ORAL_TABLET | Freq: Once | ORAL | Status: AC
Start: 2020-05-11 — End: 2020-05-11
  Administered 2020-05-11: 05:00:00 15 mg via ORAL
  Filled 2020-05-11: qty 1

## 2020-05-11 NOTE — Discharge Instructions (Signed)
Your pain seems to be from superficial thrombophlebitis.  Please come back for an ultrasound to make sure that you do not have a deep vein thrombophlebitis.  Deep vein thrombophlebitis needs to be treated with blood thinners.  If the ultrasound does not show deep vein thrombophlebitis, you will need to take aspirin for 125 mg twice a day, and apply warm compresses.  Your primary care provider may want a follow-up ultrasound to make sure that the clot is not growing.  It is okay to take acetaminophen or ibuprofen as needed for pain.  You do not need to restrict your activities.

## 2020-05-11 NOTE — ED Provider Notes (Signed)
[facility] EMERGENCY DEPARTMENT Provider Note   CSN: [id number] Arrival date & time: 05/11/20  0117    History Chief Complaint  Patient presents with  . Leg Pain    Tanya Pierce is a 24 y.o. female.  The history is provided by the patient.  Leg Pain She noted pain in her right calf medially after working a 12-hour shift.  Pain was mild to moderate and she rated it at 3/10.  It initially involved her entire medial calf, and now is well localized.  She denies any trauma.  She has not tried any treatment thus far.  Past Medical History:  Diagnosis Date  . Allergy   . Anemia   . HSV (herpes simplex virus) anogenital infection   . Ingrown toenail     Patient Active Problem List   Diagnosis Date Noted  . Delivered by cesarean section 10/06/2016  . HSV-2 infection complicating pregnancy, third trimester 10/05/2016  . Onychomycosis due to dermatophyte 01/13/2013    Past Surgical History:  Procedure Laterality Date  . ADENOIDECTOMY  1998  . CESAREAN SECTION N/A 10/06/2016   Procedure: CESAREAN SECTION;  Surgeon: Hoover Browns, MD;  [facility];  Service: Obstetrics;  Laterality: N/A;  . EYE SURGERY  2002   eye muscle  . MYRINGOTOMY  1998     OB History    Gravida  1   Para  1   Term  1   Preterm      AB      Living  1     SAB      TAB      Ectopic      Multiple  0   Live Births  1           Family History  Problem Relation Age of Onset  . Hyperlipidemia Mother   . Hypertension Father   . Hyperlipidemia Maternal Grandmother   . Cancer Maternal Grandfather        throat and prostate  . Diabetes Paternal Grandmother   . Diabetes Paternal Grandfather   . Heart disease Paternal Grandfather     Social History   Tobacco Use  . Smoking status: Never Smoker  . Smokeless tobacco: Never Used  Vaping Use  . Vaping Use: Never used  Substance Use Topics  . Alcohol use: No    Comment: none with preg  . Drug use: No     Home Medications Prior to Admission medications   Medication Sig Start Date End Date Taking? Authorizing Provider  acyclovir (ZOVIRAX) 400 MG tablet Take 400 mg by mouth every 8 (eight) hours.    [provider]  cephALEXin (KEFLEX) 500 MG capsule Take 1 capsule (500 mg total) by mouth 3 (three) times daily. 10/15/16   Horton, Mayer Masker, MD  gabapentin (NEURONTIN) 100 MG capsule Take 100 mg by mouth 3 (three) times daily.    [provider]  ibuprofen (ADVIL,MOTRIN) 600 MG tablet Take 1 tablet (600 mg total) by mouth every 6 (six) hours. 10/09/16   Gerrit Heck, CNM  oxyCODONE-acetaminophen (PERCOCET/ROXICET) 5-325 MG tablet Take 1-2 tablets by mouth every 6 (six) hours as needed (pain scale > 7). 10/09/16   Gerrit Heck, CNM  Prenatal Vit-Fe Fumarate-FA (PRENATAL MULTIVITAMIN) TABS tablet Take 1 tablet by mouth daily at 12 noon.    [provider]  senna-docusate (SENOKOT-S) 8.6-50 MG tablet Take 2 tablets by mouth daily. For 7 days, then as needed 10/10/16   Gerrit Heck,  CNM    Allergies    Monistat [miconazole]  Review of Systems   Review of Systems  All other systems reviewed and are negative.   Physical Exam Updated Vital Signs BP 111/70 (BP Location: Left Arm)   Pulse 72   Temp 98.4 F (36.9 C) (Oral)   Resp 16   Ht 5\' 3"  (1.6 m)   Wt 60.8 kg   LMP 05/01/2020 (Exact Date)   SpO2 100%   BMI 23.74 kg/m   Physical Exam Vitals and nursing note reviewed.   24 year old female, resting comfortably and in no acute distress. Vital signs are normal. Oxygen saturation is 100%, which is normal. Head is normocephalic and atraumatic. PERRLA, EOMI. Oropharynx is clear. Neck is nontender and supple without adenopathy or JVD. Back is nontender and there is no CVA tenderness. Lungs are clear without rales, wheezes, or rhonchi. Chest is nontender. Heart has regular rate and rhythm without murmur. Abdomen is soft, flat, nontender without masses or  hepatosplenomegaly and peristalsis is normoactive. Extremities have no cyanosis or edema, full range of motion is present.  There is a cord palpable in the left calf medially.  It measures about 1 cm in length and is very tender.  22' sign is negative.  Calf circumference is symmetric. Skin is warm and dry without rash. Neurologic: Mental status is normal, cranial nerves are intact, there are no motor or sensory deficits.  ED Results / Procedures / Treatments    Procedures Procedures   Medications Ordered in ED Medications  Rivaroxaban (XARELTO) tablet 15 mg (15 mg Oral Given 05/11/20 05/13/20)    ED Course  I have reviewed the triage vital signs and the nursing notes.  MDM Rules/Calculators/A&P Right calf pain with palpable cords strongly suggestive of superficial thrombophlebitis.  She will be brought back for venous ultrasound to make sure she does not have deep vein phlebitis.  She is given a dose of rivaroxaban prior to discharge.  Advised that treatment would involve aspirin and heat if ultrasound is negative.  Final Clinical Impression(s) / ED Diagnoses Final diagnoses:  Right calf pain    Rx / DC Orders ED Discharge Orders         Ordered    7616 Venous Img Lower Unilateral Right     Discontinue     05/11/20 0524           05/13/20, MD 05/11/20 0530

## 2020-05-11 NOTE — ED Triage Notes (Signed)
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

## 2021-02-23 ENCOUNTER — Telehealth: Payer: Self-pay | Admitting: Lactation Services

## 2021-02-23 NOTE — Telephone Encounter (Signed)
Lauren from [facility] LM that patient was seen a few weeks ago and had a +UPT. She was seen in the office today and was noted to have bleeding in the last few days so no US performed. Patient would like to establish care and was referred to our office.

## 2021-02-24 NOTE — Telephone Encounter (Signed)
Called pt and I verified with pt if she is still having vaginal bleeding.  Pt reports that she is continuing to have vaginal bleeding like a period changing a pad 3-4 times a day.  I advised pt that because she is having vaginal bleeding to please go to MAU for evaluation.  Pt given MAU address and contact information.  Pt verbalized understanding with no further questions.    Tanya Pierce  02/24/21

## 2021-02-25 ENCOUNTER — Inpatient Hospital Stay (HOSPITAL_COMMUNITY)
Admission: AD | Admit: 2021-02-25 | Discharge: 2021-02-25 | Disposition: A | Discharge status: Home / Self Care | Payer: No Typology Code available for payment source | Attending: Obstetrics & Gynecology | Admitting: Obstetrics & Gynecology

## 2021-02-25 ENCOUNTER — Other Ambulatory Visit: Payer: Self-pay

## 2021-02-25 ENCOUNTER — Inpatient Hospital Stay (HOSPITAL_COMMUNITY): Payer: No Typology Code available for payment source

## 2021-02-25 ENCOUNTER — Encounter (HOSPITAL_COMMUNITY): Payer: Self-pay | Admitting: Obstetrics & Gynecology

## 2021-02-25 DIAGNOSIS — Z3A01 Less than 8 weeks gestation of pregnancy: Secondary | ICD-10-CM | POA: Diagnosis not present

## 2021-02-25 DIAGNOSIS — Z883 Allergy status to other anti-infective agents status: Secondary | ICD-10-CM | POA: Diagnosis not present

## 2021-02-25 DIAGNOSIS — O209 Hemorrhage in early pregnancy, unspecified: Secondary | ICD-10-CM

## 2021-02-25 DIAGNOSIS — N760 Acute vaginitis: Secondary | ICD-10-CM

## 2021-02-25 DIAGNOSIS — O4691 Antepartum hemorrhage, unspecified, first trimester: Secondary | ICD-10-CM | POA: Diagnosis not present

## 2021-02-25 DIAGNOSIS — B9689 Other specified bacterial agents as the cause of diseases classified elsewhere: Secondary | ICD-10-CM | POA: Diagnosis not present

## 2021-02-25 DIAGNOSIS — O208 Other hemorrhage in early pregnancy: Secondary | ICD-10-CM | POA: Diagnosis not present

## 2021-02-25 DIAGNOSIS — Z791 Long term (current) use of non-steroidal anti-inflammatories (NSAID): Secondary | ICD-10-CM | POA: Insufficient documentation

## 2021-02-25 DIAGNOSIS — O23591 Infection of other part of genital tract in pregnancy, first trimester: Secondary | ICD-10-CM | POA: Insufficient documentation

## 2021-02-25 DIAGNOSIS — Z6790 Unspecified blood type, Rh positive: Secondary | ICD-10-CM

## 2021-02-25 DIAGNOSIS — Z79899 Other long term (current) drug therapy: Secondary | ICD-10-CM | POA: Diagnosis not present

## 2021-02-25 DIAGNOSIS — B373 Candidiasis of vulva and vagina: Secondary | ICD-10-CM

## 2021-02-25 DIAGNOSIS — O3680X0 Pregnancy with inconclusive fetal viability, not applicable or unspecified: Secondary | ICD-10-CM | POA: Diagnosis not present

## 2021-02-25 DIAGNOSIS — O99891 Other specified diseases and conditions complicating pregnancy: Secondary | ICD-10-CM | POA: Diagnosis not present

## 2021-02-25 DIAGNOSIS — O4690 Antepartum hemorrhage, unspecified, unspecified trimester: Secondary | ICD-10-CM

## 2021-02-25 DIAGNOSIS — O468X1 Other antepartum hemorrhage, first trimester: Secondary | ICD-10-CM

## 2021-02-25 LAB — URINALYSIS, ROUTINE W REFLEX MICROSCOPIC
Bacteria, UA: NONE SEEN
Bilirubin Urine: NEGATIVE
Glucose, UA: NEGATIVE mg/dL
Ketones, ur: NEGATIVE mg/dL
Leukocytes,Ua: NEGATIVE
Nitrite: NEGATIVE
Protein, ur: 30 mg/dL — AB
RBC / HPF: >50 RBC/hpf — ABNORMAL HIGH (ref 0–5)
Specific Gravity, Urine: 1.024 (ref 1.005–1.030)
pH: 6 (ref 5.0–8.0)

## 2021-02-25 LAB — CBC
HCT: 37.5 % (ref 36.0–46.0)
Hemoglobin: 12.3 g/dL (ref 12.0–15.0)
MCH: 29.6 pg (ref 26.0–34.0)
MCHC: 32.8 g/dL (ref 30.0–36.0)
MCV: 90.4 fL (ref 80.0–100.0)
Platelets: 233 10*3/uL (ref 150–400)
RBC: 4.15 MIL/uL (ref 3.87–5.11)
RDW: 14.6 % (ref 11.5–15.5)
WBC: 8.9 10*3/uL (ref 4.0–10.5)
nRBC: 0 % (ref 0.0–0.2)

## 2021-02-25 LAB — COMPREHENSIVE METABOLIC PANEL
ALT: 11 U/L (ref 0–44)
AST: 22 U/L (ref 15–41)
Albumin: 4.2 g/dL (ref 3.5–5.0)
Alkaline Phosphatase: 44 U/L (ref 38–126)
Anion gap: 8 (ref 5–15)
BUN: 12 mg/dL (ref 6–20)
CO2: 22 mmol/L (ref 22–32)
Calcium: 9.6 mg/dL (ref 8.9–10.3)
Chloride: 107 mmol/L (ref 98–111)
Creatinine, Ser: 0.68 mg/dL (ref 0.44–1.00)
GFR, Estimated: >60 mL/min (ref >60)
Glucose, Bld: 86 mg/dL (ref 70–99)
Potassium: 3.7 mmol/L (ref 3.5–5.1)
Sodium: 137 mmol/L (ref 135–145)
Total Bilirubin: 0.5 mg/dL (ref 0.3–1.2)
Total Protein: 7.1 g/dL (ref 6.5–8.1)

## 2021-02-25 LAB — WET PREP, GENITAL
Sperm: NONE SEEN
Trich, Wet Prep: NONE SEEN
Yeast Wet Prep HPF POC: NONE SEEN

## 2021-02-25 LAB — POCT PREGNANCY, URINE: Preg Test, Ur: POSITIVE — AB

## 2021-02-25 LAB — HCG, QUANTITATIVE, PREGNANCY: hCG, Beta Chain, Quant, S: 4814 m[IU]/mL — ABNORMAL HIGH (ref <5)

## 2021-02-25 LAB — HIV ANTIBODY (ROUTINE TESTING W REFLEX): HIV Screen 4th Generation wRfx: NONREACTIVE

## 2021-02-25 MED ORDER — METRONIDAZOLE 500 MG PO TABS: 500.0000 mg | ORAL_TABLET | Freq: Two times a day (BID) | ORAL | 0 refills | Status: AC

## 2021-02-25 NOTE — Discharge Instructions (Signed)
Subchorionic Hematoma  A hematoma is a collection of blood outside of the blood vessels. A subchorionic hematoma is a collection of blood between the outer wall of the embryo (chorion) and the inner wall of the uterus. This condition can cause vaginal bleeding. Early small hematomas usually shrink on their own and do not affect your baby or pregnancy. When bleeding starts later in pregnancy, or if the hematoma is larger or occurs in older pregnant women, the condition may be more serious. Larger hematomas increase the chances of miscarriage. This condition also increases the risk of:  Premature separation of the placenta from the uterus.  Premature (preterm) labor.  Stillbirth. What are the causes? The exact cause of this condition is not known. It occurs when blood is trapped between the placenta and the uterine wall because the placenta has separated from the original site of implantation. What increases the risk? You are more likely to develop this condition if:  You were treated with fertility medicines.  You became pregnant through in vitro fertilization (IVF). What are the signs or symptoms? Symptoms of this condition include:  Vaginal spotting or bleeding.  Abdominal pain. This is rare. Sometimes you may have no symptoms and the bleeding may only be seen when ultrasound images are taken (transvaginal ultrasound). How is this diagnosed? This condition is diagnosed based on a physical exam. This includes a pelvic exam. You may also have other tests, including:  Blood tests.  Urine tests.  Ultrasound of the abdomen. How is this treated? Treatment for this condition can vary. Treatment may include:  Watchful waiting. You will be monitored closely for any changes in bleeding.  Medicines.  Activity restriction. This may be needed until the bleeding stops.  A medicine called Rh immunoglobulin. This is given if you have an Rh-negative blood type. It prevents Rh  sensitization. Follow these instructions at home:  Stay on bed rest if told to do so by your health care provider.  Do not lift anything that is heavier than 10 lb (4.5 kg), or the limit that you are told by your health care provider.  Track and write down the number of pads you use each day and how soaked (saturated) they are.  Do not use tampons.  Keep all follow-up visits. This is important. Your health care provider may ask you to have follow-up blood tests or ultrasound tests or both. Contact a health care provider if:  You have any vaginal bleeding.  You have a fever. Get help right away if:  You have severe cramps in your stomach, back, abdomen, or pelvis.  You pass large clots or tissue. Save any tissue for your health care provider to look at.  You faint.  You become light-headed or weak. Summary  A subchorionic hematoma is a collection of blood between the outer wall of the embryo (chorion) and the inner wall of the uterus.  This condition can cause vaginal bleeding.  Sometimes you may have no symptoms and the bleeding may only be seen when ultrasound images are taken.  Treatment may include watchful waiting, medicines, or activity restriction.  Keep all follow-up visits. Get help right away if you have severe cramps or heavy vaginal bleeding. This information is not intended to replace advice given to you by your health care provider. Make sure you discuss any questions you have with your health care provider. Document Revised: 07/05/2020 Document Reviewed: 07/05/2020 Elsevier Patient Education  2021 [employer].  Miscarriage A miscarriage is the loss of pregnancy before the 20th week. Most miscarriages happen during the first 3 months of pregnancy. Sometimes, a miscarriage can happen before a woman knows that she is pregnant. Having a miscarriage can be an emotional experience. If you have had a miscarriage, talk with your health care provider  about any questions you may have about the loss of your baby, the grieving process, and your plans for future pregnancy. What are the causes? Many times, the cause of a miscarriage is not known. What increases the risk? The following factors may make a pregnant woman more likely to have a miscarriage: Certain medical conditions  Conditions that affect the hormone balance in the body, such as thyroid disease or polycystic ovary syndrome.  Diabetes.  Autoimmune disorders.  Infections.  Bleeding disorders.  Obesity. Lifestyle factors  Using products with tobacco or nicotine in them or being exposed to tobacco smoke.  Having alcohol.  Having large amounts of caffeine.  Recreational drug use. Problems with reproductive organs or structures  Cervical insufficiency. This is when the lowest part of the uterus (cervix) opens and thins before pregnancy is at term.  Having a condition called Asherman syndrome. This syndrome causes scarring in the uterus or causes the uterus to be abnormal in structure.  Fibrous growths, called fibroids, in the uterus.  Congenital abnormalities. These problems are present at birth.  Infection of the cervix or uterus. Personal or medical history  Injury (trauma).  Having had a miscarriage before.  Being younger than age 66 or older than age 63.  Exposure to harmful substances in the environment. This may include radiation or heavy metals, such as lead.  Use of certain medicines. What are the signs or symptoms? Symptoms of this condition include:  Vaginal bleeding or spotting, with or without cramps or pain.  Pain or cramping in the abdomen or lower back.  Fluid or tissue coming out of the vagina. How is this diagnosed? This condition may be diagnosed based on:  A physical exam.  Ultrasound.  Lab tests, such as blood tests, urine tests, or swabs for infection. How is this treated? Treatment for a miscarriage is sometimes not needed  if all the pregnancy tissue that was in the uterus comes out on its own, and there are no other problems such as infection or heavy bleeding. In other cases, this condition may be treated with:  Dilation and curettage (D&C). In this procedure, the cervix is stretched open and any remaining pregnancy tissue is removed from the lining of the uterus (endometrium).  Medicines. These may include: ? Antibiotic medicine, to treat infection. ? Medicine to help any remaining pregnancy tissue come out of the body. ? Medicine to reduce (contract) the size of the uterus. These medicines may be given if there is a lot of bleeding. If you have Rh-negative blood, you may be given an injection of a medicine called Rho(D) immune globulin. This medicine helps prevent problems with future pregnancies. Follow these instructions at home: Medicines  Take over-the-counter and prescription medicines only as told by your health care provider.  If you were prescribed antibiotic medicine, take it as told by your health care provider. Do not stop taking the antibiotic even if you start to feel better. Activity  Rest as told by your health care provider. Ask your health care provider what activities are safe for you.  Have someone help with home and family responsibilities during this time. General instructions  Monitor how much  tissue or blood clot material comes out of the vagina.  Do not have sex, douche, or put anything, such as tampons, in your vagina until your health care provider says it is okay.  To help you and your partner with the grieving process, talk with your health care provider or get counseling.  When you are ready, meet with your health care provider to discuss any important steps you should take for your health. Also, discuss steps you should take to have a healthy pregnancy in the future.  Keep all follow-up visits. This is important.   Where to find more information  [employer]  of Obstetricians and Gynecologists: acog.org  [place] Department of Health and Cytogeneticist of Women's Health: http://hoffman.com/ Contact a health care provider if:  You have a fever or chills.  There is bad-smelling fluid coming from the vagina.  You have more bleeding instead of less.  Tissue or blood clots come out of your vagina. Get help right away if:  You have severe cramps or pain in your back or abdomen.  Heavy bleeding soaks through 2 large sanitary pads an hour for more than 2 hours.  You become light-headed or weak.  You faint.  You feel sad, and your sadness takes over your thoughts.  You think about hurting yourself. If you ever feel like you may hurt yourself or others, or have thoughts about taking your own life, get help right away. Go to your nearest emergency department or:  Call your local emergency services (911 in the [place]  Call a suicide crisis helpline, such as the National Suicide Prevention Lifeline at (978)878-9596. This is open 24 hours a day in the [place]  Text the Crisis Text Line at (620)476-4824 (in the [place] Summary  Most miscarriages happen in the first 3 months of pregnancy. Sometimes miscarriage happens before a woman knows that she is pregnant.  Follow instructions from your health care provider about medicines and activity.  To help you and your partner with grieving, talk with your health care provider or get counseling.  Keep all follow-up visits. This information is not intended to replace advice given to you by your health care provider. Make sure you discuss any questions you have with your health care provider. Document Revised: 04/09/2020 Document Reviewed: 04/09/2020 Elsevier Patient Education  2021 Elsevier Inc.        Ectopic Pregnancy  An ectopic pregnancy happens when a fertilized egg attaches (implants) outside the uterus. In a normal pregnancy, a fertilized egg implants in the uterus. An ectopic  pregnancy cannot develop into a healthy baby. Most ectopic pregnancies occur in one of the fallopian tubes, which is where an egg travels from an ovary to get to the uterus. This is called a tubal pregnancy. An ectopic pregnancy can also happen on an ovary, on the cervix, or in the abdomen. When a fertilized egg implants on tissue outside the uterus and begins to grow, it may cause the tissue to tear or burst. This is known as a ruptured ectopic pregnancy. The tear or burst causes internal bleeding. This may cause intense pain in the abdomen. An ectopic pregnancy is a medical emergency and can be life-threatening. What are the causes? The most common cause of this condition is damage to one of the fallopian tubes. A fallopian tube may be narrowed or blocked, and that stops the fertilized egg from reaching the uterus. Sometimes, the cause of this condition is not known. What increases the risk?  The following factors may make you more likely to develop this condition:  Having gone through infertility treatment before.  Having had an ectopic pregnancy before.  Having had surgery to have the fallopian tubes tied.  Becoming pregnant while using an intrauterine device for birth control.  Taking birth control pills before the age of 27. Other risk factors include:  Smoking.  Alcohol use.  History of DES exposure. DES is a medicine that was used until 1971 and affected babies whose mothers took the medicine. What are the signs or symptoms? Common symptoms of this condition include:  Missing a menstrual period.  Nausea or tiredness.  Tender breasts.  Other normal pregnancy symptoms. Other symptoms may include:  Pain during sex.  Vaginal bleeding or spotting.  Cramping or pain in the lower abdomen.  A fast heartbeat, low blood pressure, and sweating.  Pain or increased pressure while having a bowel movement. Symptoms of a ruptured ectopic pregnancy and internal bleeding may  include:  Sudden, severe pain in the abdomen.  Dizziness, weakness, feeling light-headed, or fainting.  Pain in the shoulder or neck area. How is this diagnosed? This condition is diagnosed by:  A blood test to check for the pregnancy hormone.  A pelvic exam to find painful areas or a mass in the abdomen.  Ultrasound. A probe is inserted into the vagina to see if there is a pregnancy in or outside the uterus.  Taking a sample of tissue from the uterus.  Surgery to look closely at the fallopian tubes through an incision in the abdomen. How is this treated? This condition is usually treated with medicine or surgery. Sometimes, ectopic pregnancies can resolve on their own, under close monitoring by your health care provider. Medicine A medicine called methotrexate may be given to cause the pregnancy tissue to be absorbed. The medicine may be given if:  The diagnosis is made early, with no signs of active bleeding.  The fallopian tube has not torn or burst. You will need blood tests to make sure the medicine is working. It may take 4-6 weeks for the pregnancy tissues to be absorbed. Surgery Surgery may be performed to:  Remove the pregnancy tissue.  Stop internal bleeding.  Remove part or all of the fallopian tube.  Remove the uterus. This is rare. After surgery, you may need to have blood tests to make sure the surgery worked. Follow these instructions at home: Medicines  Take over-the-counter and prescription medicines only as told by your health care provider.  Ask your health care provider if the medicine prescribed to you: ? Requires you to avoid driving or using machinery. ? Can cause constipation. You may need to take these actions to prevent or treat constipation:  Drink enough fluid to keep your urine pale yellow.  Take over-the-counter or prescription medicines.  Eat foods that are high in fiber, such as beans, whole grains, and fresh fruits and  vegetables.  Limit foods that are high in fat and processed sugars, such as fried or sweet foods. General instructions  Rest or limit your activity, if told by your health care provider.  Do not have sex or put anything in your vagina, such as tampons or douches, for 6 weeks or until your health care provider says it is safe.  Do not lift anything that is heavier than 10 lb (4.5 kg), or the limit that you are told, until your health care provider says that it is safe.  Return to your normal activities  as told by your health care provider. Ask your health care provider what activities are safe for you.  Keep all follow-up visits. This is important. Contact a health care provider if:  You have a fever or chills.  You have nausea and vomiting. Get help right away if:  Your pain gets worse or is not relieved by medicine.  You feel dizzy or weak.  You feel light-headed or you faint.  You have sudden, severe pain in your abdomen.  You have sudden pain in the shoulder or neck area. Summary  An ectopic pregnancy happens when a fertilized egg implants outside the uterus. Most ectopic pregnancies occur in one of the fallopian tubes.  An ectopic pregnancy is a medical emergency and can be life-threatening.  The most common cause of this condition is damage to one of the fallopian tubes.  This condition is usually treated with medicine or surgery. Some ectopic pregnancies resolve on their own, under close monitoring by your health care provider. This information is not intended to replace advice given to you by your health care provider. Make sure you discuss any questions you have with your health care provider. Document Revised: 01/20/2020 Document Reviewed: 01/20/2020 Elsevier Patient Education  2021 Elsevier Inc.        Ruptured Ectopic Pregnancy  An ectopic pregnancy happens when a fertilized egg attaches (implants) outside the uterus, usually in one of the fallopian  tubes. This is where an egg travels from an ovary to get to the uterus. An ectopic pregnancy cannot develop into a healthy baby. When a fertilized egg implants on tissue outside the uterus and begins to grow, it may cause the tissue to tear or burst. This is known as a ruptured ectopic pregnancy. The tear or burst causes internal bleeding. This may cause intense pain in the abdomen. A ruptured ectopic pregnancy can affect the ability to have children (fertility), depending on damage it causes to the reproductive organs. A ruptured ectopic pregnancy is a medical emergency. If not treated right away, it can lead to blood loss or shock, and it can be life-threatening. What are the causes? An ectopic pregnancy ruptures because it is growing in a spot that is not meant to expand and support the growth of a pregnancy. What increases the risk? You are more likely to have a ruptured ectopic pregnancy if:  You have an ectopic pregnancy, but you do not have any symptoms and the pregnancy is not found early enough to treat it before it ruptures.  You have nonsurgical treatment of an ectopic pregnancy.  You choose not to have any treatment for an ectopic pregnancy. What are the signs or symptoms? Symptoms of a ruptured ectopic pregnancy and internal bleeding may include:  Sudden, severe pain in the abdomen.  Feeling dizzy, weak, or light-headed.  Fainting.  Pain in the shoulder or neck area. How is this diagnosed? This condition is diagnosed based on your medical history, symptoms, a physical exam, and testing. Testing may include an ultrasound and blood tests. How is this treated? This condition is treated with IV fluids and emergency surgery to remove the ectopic pregnancy and repair the area where the rupture occurred. If a lot of blood was lost, donated blood may be needed (blood transfusion). You may receive a Rho (D) immune globulin shot if you are Rh negative and your baby's father is Rh  positive, or if the Rh type of the father is unknown. This shot is given to prevent Rh problems  in future pregnancies. You may receive other medicines. Summary  An ectopic pregnancy happens when a fertilized egg attaches (implants) outside the uterus, usually in one of the fallopian tubes. When a fertilized egg implants on tissue outside the uterus and begins to grow, it may cause the tissue to tear or burst. This is known as a ruptured ectopic pregnancy.  A ruptured ectopic pregnancy is a medical emergency. If not treated right away, it can lead to blood loss or shock, and it can be life-threatening.  This condition is treated with IV fluids and emergency surgery to remove the ectopic pregnancy and repair the area where the rupture occurred. If a lot of blood was lost, donated blood may be needed. This information is not intended to replace advice given to you by your health care provider. Make sure you discuss any questions you have with your health care provider. Document Revised: 01/20/2020 Document Reviewed: 01/20/2020 Elsevier Patient Education  2021 Else[employer] Inc.        Vaginal Bleeding During Pregnancy, First Trimester A small amount of bleeding from the vagina, or spotting, is common during early pregnancy. Some bleeding may be related to the pregnancy, and some may not. In many cases, the bleeding is normal and is not a problem. However, bleeding can also be a sign of something serious. Normal things that may cause bleeding during the first trimester:  Implantation of the fertilized egg in the lining of the uterus.  Rapid changes in blood vessels. This is caused by changes that are happening to the body during pregnancy.  Sex.  Pelvic exams. Abnormal things that may cause bleeding during the first trimester include:  Infection or inflammation of the cervix.  Growths or polyps on the cervix.  Miscarriage or threatened miscarriage.  Pregnancy that is growing outside of  the uterus (ectopic pregnancy).  A fertilized egg that becomes a mass of tissue (molar pregnancy). Tell your health care provider right away if there is any bleeding from your vagina. Follow these instructions at home: Monitoring your bleeding Monitor your bleeding.  Pay attention to any changes in your symptoms. Let your health care provider know about any concerns.  Try to understand when the bleeding occurs. Does the bleeding start on its own, or does it start after something is done, such as sex or a pelvic exam?  Use a diary to record the things you see about your bleeding, including: ? The kind of bleeding you are having. Does the bleeding start and stop irregularly, or is it a constant flow? ? The severity of your bleeding. Is the bleeding heavy or light? ? The number of pads you use each day, how often you change them, and how soaked they are.  Tell your health care provider if you pass tissue. He or she may want to see it.   Activity  Follow instructions from your health care provider about limiting your activity. Ask what activities are safe for you.  Do not have sex until your health care provider says that this is safe.  If needed, make plans for someone to help with your regular activities. General instructions  Take over-the-counter and prescription medicines only as told by your health care provider.  Do not take aspirin because it can cause bleeding.  Do not use tampons or douche.  Keep all follow-up visits. This is important. Contact a health care provider if:  You have vaginal bleeding during any part of your pregnancy.  You have cramps or labor  pains.  You have a fever or chills. Get help right away if:  You have severe cramps in your back or abdomen.  You pass large clots or a large amount of tissue from your vagina.  Your bleeding increases.  You feel light-headed or weak, or you faint.  You are leaking fluid or have a gush of fluid from your  vagina. Summary  A small amount of bleeding from the vagina is common during early pregnancy.  Be sure to tell your health care provider about any vaginal bleeding right away.  Try to understand when bleeding occurs. Does bleeding occur on its own, or does it occur after something is done, such as sex or pelvic exams?  Keep all follow-up visits. This is important. This information is not intended to replace advice given to you by your health care provider. Make sure you discuss any questions you have with your health care provider. Document Revised: 07/01/2020 Document Reviewed: 07/01/2020 Elsevier Patient Education  2021 Else[employer] Inc.         Bacterial Vaginosis  Bacterial vaginosis is an infection that occurs when the normal balance of bacteria in the vagina changes. This change is caused by an overgrowth of certain bacteria in the vagina. Bacterial vaginosis is the most common vaginal infection among females aged 52 to 36 years. This condition increases the risk of sexually transmitted infections (STIs). Treatment can help reduce this risk. Treatment is very important for pregnant women because this condition can cause babies to be born early (prematurely) or at a low birth weight. What are the causes? This condition is caused by an increase in harmful bacteria that are normally present in small amounts in the vagina. However, the exact reason this condition develops is not known. You cannot get bacterial vaginosis from toilet seats, bedding, swimming pools, or contact with objects around you. What increases the risk? The following factors may make you more likely to develop this condition:  Having a new sexual partner or multiple sexual partners, or having unprotected sex.  Douching.  Having an intrauterine device (IUD).  Smoking.  Abusing drugs and alcohol. This may lead to riskier sexual behavior.  Taking certain antibiotic medicines.  Being pregnant. What are the  signs or symptoms? Some women with this condition have no symptoms. Symptoms may include:  Wallace Cullens or white vaginal discharge. The discharge can be watery or foamy.  A fish-like odor with discharge, especially after sex or during menstruation.  Itching in and around the vagina.  Burning or pain with urination. How is this diagnosed? This condition is diagnosed based on:  Your medical history.  A physical exam of the vagina.  Checking a sample of vaginal fluid for harmful bacteria or abnormal cells. How is this treated? This condition is treated with antibiotic medicines. These may be given as a pill, a vaginal cream, or a medicine that is put into the vagina (suppository). If the condition comes back after treatment, a second round of antibiotics may be needed. Follow these instructions at home: Medicines  Take or apply over-the-counter and prescription medicines only as told by your health care provider.  Take or apply your antibiotic medicine as told by your health care provider. Do not stop using the antibiotic even if you start to feel better. General instructions  If you have a female sexual partner, tell her that you have a vaginal infection. She should follow up with her health care provider. If you have a female sexual partner, he does  not need treatment.  Avoid sexual activity until you finish treatment.  Drink enough fluid to keep your urine pale yellow.  Keep the area around your vagina and rectum clean. ? Wash the area daily with warm water. ? Wipe yourself from front to back after using the toilet.  If you are breastfeeding, talk to your health care provider about continuing breastfeeding during treatment.  Keep all follow-up visits. This is important. How is this prevented? Self-care  Do not douche.  Wash the outside of your vagina with warm water only.  Wear cotton or cotton-lined underwear.  Avoid wearing tight pants and pantyhose, especially during the  summer. Safe sex  Use protection when having sex. This includes: ? Using condoms. ? Using dental dams. This is a thin layer of a material made of latex or polyurethane that protects the mouth during oral sex.  Limit the number of sexual partners. To help prevent bacterial vaginosis, it is best to have sex with just one partner (monogamous relationship).  Make sure you and your sexual partner are tested for STIs. Drugs and alcohol  Do not use any products that contain nicotine or tobacco. These products include cigarettes, chewing tobacco, and vaping devices, such as e-cigarettes. If you need help quitting, ask your health care provider.  Do not use drugs.  Do not drink alcohol if: ? Your health care provider tells you not to do this. ? You are pregnant, may be pregnant, or are planning to become pregnant.  If you drink alcohol: ? Limit how much you have to 0-1 drink a day. ? Be aware of how much alcohol is in your drink. In the [place] one drink equals one 12 oz bottle of beer (355 mL), one 5 oz glass of wine (148 mL), or one 1 oz glass of hard liquor (44 mL). Where to find more information  Centers for Disease Control and Prevention: FootballExhibition.com.br  American Sexual Health Association (ASHA): www.ashastd.org  [place] Department of Health and Health and safety inspector, Office on Women's Health: http://hoffman.com/ Contact a health care provider if:  Your symptoms do not improve, even after treatment.  You have more discharge or pain when urinating.  You have a fever or chills.  You have pain in your abdomen or pelvis.  You have pain during sex.  You have vaginal bleeding between menstrual periods. Summary  Bacterial vaginosis is a vaginal infection that occurs when the normal balance of bacteria in the vagina changes. It results from an overgrowth of certain bacteria.  This condition increases the risk of sexually transmitted infections (STIs). Getting treated can help reduce this  risk.  Treatment is very important for pregnant women because this condition can cause babies to be born early (prematurely) or at low birth weight.  This condition is treated with antibiotic medicines. These may be given as a pill, a vaginal cream, or a medicine that is put into the vagina (suppository). This information is not intended to replace advice given to you by your health care provider. Make sure you discuss any questions you have with your health care provider. Document Revised: 04/08/2020 Document Reviewed: 04/08/2020 Elsevier Patient Education  2021 Elsevier Inc.                        Safe Medications in Pregnancy    Acne: Benzoyl Peroxide Salicylic Acid  Backache/Headache: Tylenol: 2 regular strength every 4 hours OR  2 Extra strength every 6 hours  Colds/Coughs/Allergies: Benadryl (alcohol free) 25 mg every 6 hours as needed Breath right strips Claritin Cepacol throat lozenges Chloraseptic throat spray Cold-Eeze- up to three times per day Cough drops, alcohol free Flonase (by prescription only) Guaifenesin Mucinex Robitussin DM (plain only, alcohol free) Saline nasal spray/drops Sudafed (pseudoephedrine) & Actifed ** use only after [redacted] weeks gestation and if you do not have high blood pressure Tylenol Vicks Vaporub Zinc lozenges Zyrtec   Constipation: Colace Ducolax suppositories Fleet enema Glycerin suppositories Metamucil Milk of magnesia Miralax Senokot Smooth move tea  Diarrhea: Kaopectate Imodium A-D  *NO pepto Bismol  Hemorrhoids: Anusol Anusol HC Preparation H Tucks  Indigestion: Tums Maalox Mylanta Zantac  Pepcid  Insomnia: Benadryl (alcohol free)  every 6 hours as needed Tylenol PM Unisom, no Gelcaps  Leg Cramps: Tums MagGel  Nausea/Vomiting:  Bonine Dramamine Emetrol Ginger extract Sea bands Meclizine  Nausea medication to take during pregnancy:  Unisom (doxylamine succinate 25 mg  tablets) Take one tablet daily at bedtime. If symptoms are not adequately controlled, the dose can be increased to a maximum recommended dose of two tablets daily (1/2 tablet in the morning, 1/2 tablet mid-afternoon and one at bedtime). Vitamin B6  tablets. Take one tablet twice a day (up to 200 mg per day).  Skin Rashes: Aveeno products Benadryl cream or  every 6 hours as needed Calamine Lotion 1% cortisone cream  Yeast infection: Gyne-lotrimin 7 Monistat 7   **If taking multiple medications, please check labels to avoid duplicating the same active ingredients **take medication as directed on the label ** Do not exceed 4000 mg of tylenol in 24 hours **Do not take medications that contain aspirin or ibuprofen

## 2021-02-25 NOTE — MAU Note (Signed)
Presents with c/o VB that began on Tuesday, reports changing pad every 2 hours, not completely saturated.  Started passing "very small" clots yesterday.  Reports VB has distinctive odor.  LMP 01/08/2021.  +HPT and @ [facility].

## 2021-02-25 NOTE — MAU Provider Note (Signed)
History     CSN: [id number]  Arrival date and time: 02/25/21 1049   None     Chief Complaint  Patient presents with  . Vaginal Bleeding   Tanya Pierce is a 25 y.o. G2P1001 at [id number] who presents to MAU for vaginal bleeding which began Tuesday. Patient reports on Tuesday the bleeding was just pink/brown spotting, but then slowly progressed to bleeding like a period with intermittent cramping. Patient also reports vaginal odor the same as last time she had BV.  Pt denies vaginal discharge/itching. Pt denies N/V, abdominal pain, constipation, diarrhea, or urinary problems. Pt denies fever, chills, fatigue, sweating or changes in appetite. Pt denies SOB or chest pain. Pt denies dizziness, HA, light-headedness, weakness.   OB History    Gravida  2   Para  1   Term  1   Preterm      AB      Living  1     SAB      IAB      Ectopic      Multiple  0   Live Births  1           Past Medical History:  Diagnosis Date  . Allergy   . Anemia   . HSV (herpes simplex virus) anogenital infection   . Ingrown toenail     Past Surgical History:  Procedure Laterality Date  . ADENOIDECTOMY  1998  . CESAREAN SECTION N/A 10/06/2016   Procedure: CESAREAN SECTION;  Surgeon: Hoover BrownsEma Kulwa, MD;  Location: [facility];  Service: Obstetrics;  Laterality: N/A;  . EYE SURGERY  2002   eye muscle  . MYRINGOTOMY  1998    Family History  Problem Relation Age of Onset  . Hyperlipidemia Mother   . Hypertension Father   . Hyperlipidemia Maternal Grandmother   . Cancer Maternal Grandfather        throat and prostate  . Diabetes Paternal Grandmother   . Diabetes Paternal Grandfather   . Heart disease Paternal Grandfather     Social History   Tobacco Use  . Smoking status: Never Smoker  . Smokeless tobacco: Never Used  Vaping Use  . Vaping Use: Never used  Substance Use Topics  . Alcohol use: No    Comment: none with preg  . Drug use: No    Allergies:   Allergies  Allergen Reactions  . Monistat [Miconazole] Swelling and Rash    Medications Prior to Admission  Medication Sig Dispense Refill Last Dose  . gabapentin (NEURONTIN) 100 MG capsule Take 100 mg by mouth 3 (three) times daily.     Marland Kitchen. ibuprofen (ADVIL,MOTRIN) 600 MG tablet Take 1 tablet (600 mg total) by mouth every 6 (six) hours. 30 tablet 1   . oxyCODONE-acetaminophen (PERCOCET/ROXICET) 5-325 MG tablet Take 1-2 tablets by mouth every 6 (six) hours as needed (pain scale > 7). 30 tablet 0   . Prenatal Vit-Fe Fumarate-FA (PRENATAL MULTIVITAMIN) TABS tablet Take 1 tablet by mouth daily at 12 noon.     . senna-docusate (SENOKOT-S) 8.6-50 MG tablet Take 2 tablets by mouth daily. For 7 days, then as needed 14 tablet 1     Review of Systems  Constitutional: Negative for chills, diaphoresis, fatigue and fever.  Eyes: Negative for visual disturbance.  Respiratory: Negative for shortness of breath.   Cardiovascular: Negative for chest pain.  Gastrointestinal: Negative for abdominal pain, constipation, diarrhea, nausea and vomiting.  Genitourinary: Positive for vaginal bleeding. Negative for dysuria, flank pain,  frequency, pelvic pain, urgency and vaginal discharge.  Neurological: Negative for dizziness, weakness, light-headedness and headaches.   Physical Exam   Blood pressure (!) 93/53, pulse 68, temperature 98.2 F (36.8 C), temperature source Oral, resp. rate 19, height 5\' 3"  (1.6 m), weight 61.1 kg, last menstrual period 01/08/2021, SpO2 100 %, currently breastfeeding.  Patient Vitals for the past 24 hrs:  BP Temp Temp src Pulse Resp SpO2 Height Weight  02/25/21 1121 (!) 93/53 98.2 F (36.8 C) Oral 68 19 100 % 5\' 3"  (1.6 m) 61.1 kg   Physical Exam Vitals and nursing note reviewed.  Constitutional:      General: She is not in acute distress.    Appearance: Normal appearance. She is not ill-appearing, toxic-appearing or diaphoretic.  HENT:     Head: Normocephalic and  atraumatic.  Pulmonary:     Effort: Pulmonary effort is normal.  Neurological:     Mental Status: She is alert and oriented to person, place, and time.  Psychiatric:        Mood and Affect: Mood normal.        Behavior: Behavior normal.        Thought Content: Thought content normal.        Judgment: Judgment normal.    Results for orders placed or performed during the hospital encounter of 02/25/21 (from the past 24 hour(s))  Pregnancy, urine POC     Status: Abnormal   Collection Time: 02/25/21 11:06 AM  Result Value Ref Range   Preg Test, Ur POSITIVE (A) NEGATIVE  Urinalysis, Routine w reflex microscopic     Status: Abnormal   Collection Time: 02/25/21 11:09 AM  Result Value Ref Range   Color, Urine YELLOW YELLOW   APPearance HAZY (A) CLEAR   Specific Gravity, Urine 1.024 1.005 - 1.030   pH 6.0 5.0 - 8.0   Glucose, UA NEGATIVE NEGATIVE mg/dL   Hgb urine dipstick LARGE (A) NEGATIVE   Bilirubin Urine NEGATIVE NEGATIVE   Ketones, ur NEGATIVE NEGATIVE mg/dL   Protein, ur 30 (A) NEGATIVE mg/dL   Nitrite NEGATIVE NEGATIVE   Leukocytes,Ua NEGATIVE NEGATIVE   RBC / HPF >50 (H) 0 - 5 RBC/hpf   WBC, UA 0-5 0 - 5 WBC/hpf   Bacteria, UA NONE SEEN NONE SEEN   Squamous Epithelial / LPF 6-10 0 - 5   Mucus PRESENT   CBC     Status: None   Collection Time: 02/25/21 11:44 AM  Result Value Ref Range   WBC 8.9 4.0 - 10.5 K/uL   RBC 4.15 3.87 - 5.11 MIL/uL   Hemoglobin 12.3 12.0 - 15.0 g/dL   HCT 04/27/21 04/27/21 - 40.9 %   MCV 90.4 80.0 - 100.0 fL   MCH 29.6 26.0 - 34.0 pg   MCHC 32.8 30.0 - 36.0 g/dL   RDW 81.1 91.4 - 78.2 %   Platelets 233 150 - 400 K/uL   nRBC 0.0 0.0 - 0.2 %  Comprehensive metabolic panel     Status: None   Collection Time: 02/25/21 11:44 AM  Result Value Ref Range   Sodium 137 135 - 145 mmol/L   Potassium 3.7 3.5 - 5.1 mmol/L   Chloride 107 98 - 111 mmol/L   CO2 22 22 - 32 mmol/L   Glucose, Bld 86 70 - 99 mg/dL   BUN 12 6 - 20 mg/dL   Creatinine, Ser 21.3 0.44 -  1.00 mg/dL   Calcium 9.6 8.9 - 04/27/21 mg/dL   Total Protein  7.1 6.5 - 8.1 g/dL   Albumin 4.2 3.5 - 5.0 g/dL   AST 22 15 - 41 U/L   ALT 11 0 - 44 U/L   Alkaline Phosphatase 44 38 - 126 U/L   Total Bilirubin 0.5 0.3 - 1.2 mg/dL   GFR, Estimated >57 >84 mL/min   Anion gap 8 5 - 15  hCG, quantitative, pregnancy     Status: Abnormal   Collection Time: 02/25/21 11:44 AM  Result Value Ref Range   hCG, Beta Chain, Quant, S 4,814 (H) <5 mIU/mL  HIV Antibody (routine testing w rflx)     Status: None   Collection Time: 02/25/21 11:44 AM  Result Value Ref Range   HIV Screen 4th Generation wRfx Non Reactive Non Reactive  Wet prep, genital     Status: Abnormal   Collection Time: 02/25/21 12:03 PM  Result Value Ref Range   Yeast Wet Prep HPF POC NONE SEEN NONE SEEN   Trich, Wet Prep NONE SEEN NONE SEEN   Clue Cells Wet Prep HPF POC PRESENT (A) NONE SEEN   WBC, Wet Prep HPF POC MODERATE (A) NONE SEEN   Sperm NONE SEEN     US OB LESS THAN 14 WEEKS WITH OB TRANSVAGINAL  Result Date: 02/25/2021 CLINICAL DATA:  Vaginal bleeding x3 days. EXAM: OBSTETRIC <14 WK [place] AND TRANSVAGINAL OB US TECHNIQUE: Both transabdominal and transvaginal ultrasound examinations were performed for complete evaluation of the gestation as well as the maternal uterus, adnexal regions, and pelvic cul-de-sac. Transvaginal technique was performed to assess early pregnancy. COMPARISON:  None. FINDINGS: Intrauterine gestational sac: Single Yolk sac:  Not Visualized. Embryo:  Not Visualized. Cardiac Activity: Not Visualized. Heart Rate: N/A  bpm MSD: 8.9 mm   5 w   4 d Subchorionic hemorrhage:  Small Maternal uterus/adnexae: A corpus lutein cyst is seen within an otherwise normal appearing right ovary. The left ovary is visualized and is normal in appearance. No pelvic free fluid is seen. IMPRESSION: Single intrauterine gestational sac, at approximately 5 weeks and 4 days gestation by ultrasound evaluation, without visualization of a yolk  sac or fetal pole. While this may be secondary to early intrauterine pregnancy, correlation with follow-up pelvic ultrasound is recommended. Electronically Signed   By: Aram Candela M.D.   On: 02/25/2021 16:13   MAU Course  Procedures  MDM -r/o ectopic -UA: hazy/lg hgb/30PRO -CBC: WNL -CMP: WNL -[place]: PUL, sm SCH -hCG: 4,814 -ABO: O Positive -WetPrep: +ClueCells (pt reports hx of BV with abnormal odor that she is also experiencing today, will send metronidazole) -GC/CT collected -consulted with Dr. Vergie Living given PUL and hCG 4,814, per Dr. Vergie Living, can repeat US and hCG on Sunday  Discussed with client the diagnosis of pregnancy of unknown anatomic location.  Three possibilities of outcome are: a healthy pregnancy that is too early to see a yolk sac to confirm the pregnancy is in the uterus, a pregnancy that is not healthy and has not developed and will not develop, and an ectopic pregnancy that is in the abdomen that cannot be identified at this time.  And ectopic pregnancy can be a life threatening situation as a pregnancy needs to be in the uterus which is a muscle and can stretch to accommodate the growth of a pregnancy.  Other structures in the pelvis and abdomen as not muscular and do not stretch with the growth of a pregnancy.  Worst case scenario is that a structure ruptures with a growing pregnancy not in the  uterus and and internal hemorrhage can be a life threatening situation.  We need to follow the progression of this pregnancy carefully.  We need to check another serum pregnancy hormone level to determine if the levels are rising appropriately  and to determine the next steps that are needed for you. Patient's questions were answered. -pt discharged to home in stable condition  Orders Placed This Encounter  Procedures  . Wet prep, genital    Standing Status:   Standing    Number of Occurrences:   1  . US OB LESS THAN 14 WEEKS WITH OB TRANSVAGINAL    Standing Status:    Standing    Number of Occurrences:   1    Order Specific Question:   Symptom/Reason for Exam    Answer:   Vaginal bleeding in pregnancy [[id number]]  . Urinalysis, Routine w reflex microscopic Urine, Clean Catch    Standing Status:   Standing    Number of Occurrences:   1  . CBC    Standing Status:   Standing    Number of Occurrences:   1  . Comprehensive metabolic panel    Standing Status:   Standing    Number of Occurrences:   1  . hCG, quantitative, pregnancy    Standing Status:   Standing    Number of Occurrences:   1  . HIV Antibody (routine testing w rflx)    Standing Status:   Standing    Number of Occurrences:   1  . Pregnancy, urine POC    Standing Status:   Standing    Number of Occurrences:   1  . Discharge patient    Order Specific Question:   Discharge disposition    Answer:   01-Home or Self Care [1]    Order Specific Question:   Discharge patient date    Answer:   02/25/2021   Meds ordered this encounter  Medications  . metroNIDAZOLE (FLAGYL) 500 MG tablet    Sig: Take 1 tablet (500 mg total) by mouth 2 (two) times daily for 7 days.    Dispense:  14 tablet    Refill:  0    Order Specific Question:   Supervising Provider    Answer:   Jaynie Collins A [[id number]]   Assessment and Plan   1. Pregnancy of unknown anatomic location   2. Vaginal bleeding in pregnancy   3. Blood type, Rh positive   4. Bacterial vaginosis   5. Subchorionic hemorrhage of placenta in first trimester, single or unspecified fetus    -will call with culture results, if positive -safe meds in pregnancy list given -discussed ectopic vs. Normal IUP vs. miscarriage -strict ectopic precautions given -return MAU precautions -f/u on 02/26/2021 at MAU for repeat hCG and [place] -pt discharged to home in stable condition  Tanya Pierce 02/25/2021, 4:51 PM

## 2021-02-27 ENCOUNTER — Inpatient Hospital Stay (HOSPITAL_COMMUNITY)
Admission: AD | Admit: 2021-02-27 | Discharge: 2021-02-27 | Disposition: A | Discharge status: Home / Self Care | Payer: No Typology Code available for payment source | Source: Ambulatory Visit | Attending: Obstetrics & Gynecology | Admitting: Obstetrics & Gynecology

## 2021-02-27 ENCOUNTER — Other Ambulatory Visit: Payer: Self-pay

## 2021-02-27 DIAGNOSIS — O209 Hemorrhage in early pregnancy, unspecified: Secondary | ICD-10-CM | POA: Diagnosis not present

## 2021-02-27 DIAGNOSIS — Z3A01 Less than 8 weeks gestation of pregnancy: Secondary | ICD-10-CM | POA: Insufficient documentation

## 2021-02-27 DIAGNOSIS — O3680X0 Pregnancy with inconclusive fetal viability, not applicable or unspecified: Secondary | ICD-10-CM | POA: Diagnosis not present

## 2021-02-27 LAB — HCG, QUANTITATIVE, PREGNANCY: hCG, Beta Chain, Quant, S: 4678 m[IU]/mL — ABNORMAL HIGH (ref <5)

## 2021-02-27 NOTE — MAU Note (Signed)
Pt reports to mau for follow up blood work.  Reports bleeding has lightened and denies pain today.

## 2021-02-27 NOTE — MAU Provider Note (Signed)
History   Chief Complaint:  Follow-up   Tanya Pierce is  25 y.o. G2P1001 Patient's last menstrual period was 01/08/2021.Marland Kitchen Patient is here for follow up of quantitative HCG and ongoing surveillance of pregnancy status. She is [redacted]w[redacted]d weeks gestation  by LMP.    Since her last visit, the patient is without new complaint. The patient reports bleeding as  spotting.  She denies any pain.  General ROS:  positive for vaginal bleeding  Her previous Quantitative HCG values are: Results for Tanya, Pierce (MRN [id number]) as of 02/27/2021 18:15  Ref. Range 02/25/2021 11:44  HCG, Beta Chain, Quant, S Latest Ref Range: <5 mIU/mL 4,814 (H)   Physical Exam   Blood pressure (!) 107/59, pulse 69, temperature 98 F (36.7 C), temperature source Oral, resp. rate 17, last menstrual period 01/08/2021, SpO2 99 %, currently breastfeeding.  Physical Exam Vitals and nursing note reviewed.  Constitutional:      General: She is not in acute distress.    Appearance: Normal appearance. She is not ill-appearing.  Cardiovascular:     Rate and Rhythm: Normal rate.  Pulmonary:     Effort: Pulmonary effort is normal. No respiratory distress.  Neurological:     Mental Status: She is alert.  Psychiatric:        Behavior: Behavior normal.        Thought Content: Thought content normal.        Judgment: Judgment normal.     Labs: Results for orders placed or performed during the hospital encounter of 02/27/21 (from the past 24 hour(s))  hCG, quantitative, pregnancy   Collection Time: 02/27/21  3:57 PM  Result Value Ref Range   hCG, Beta Chain, Quant, S 4,678 (H) <5 mIU/mL   Assessment:   1. Pregnancy of unknown anatomic location   2. [redacted] weeks gestation of pregnancy     Consulted with Dr. Macon Large- because patient is physically stable, recommends repeat HCG in 48 hours and MTX treatment if plateaued at that point.   Discussed with client the diagnosis of pregnancy of unknown anatomic location.  Three  possibilities of outcome are: a healthy pregnancy that is too early to see a yolk sac to confirm the pregnancy is in the uterus, a pregnancy that is not healthy and has not developed and will not develop, and an ectopic pregnancy that is in the abdomen that cannot be identified at this time.  And ectopic pregnancy can be a life threatening situation as a pregnancy needs to be in the uterus which is a muscle and can stretch to accommodate the growth of a pregnancy.  Other structures in the pelvis and abdomen as not muscular and do not stretch with the growth of a pregnancy.  Worst case scenario is that a structure ruptures with a growing pregnancy not in the uterus and and internal hemorrhage can be a life threatening situation.  We need to follow the progression of this pregnancy carefully.  We need to check another serum pregnancy hormone level to determine if the levels are rising appropriately and to determine the next steps that are needed for you. Patient's questions were answered.  Plan: -Discharge home in stable condition -Strict ectopic precautions discussed -Patient advised to follow-up with MAU on 5/10 for repeat HCG and possibly MTX, orders placed -Patient may return to MAU as needed or if her condition were to change or worsen  Tanya Pierce, CNM 02/27/2021, 6:14 PM

## 2021-02-28 ENCOUNTER — Inpatient Hospital Stay (HOSPITAL_COMMUNITY): Payer: No Typology Code available for payment source

## 2021-02-28 ENCOUNTER — Inpatient Hospital Stay (HOSPITAL_COMMUNITY)
Admission: AD | Admit: 2021-02-28 | Discharge: 2021-03-01 | Disposition: A | Discharge status: Home / Self Care | Payer: No Typology Code available for payment source | Attending: Family Medicine | Admitting: Family Medicine

## 2021-02-28 ENCOUNTER — Other Ambulatory Visit: Payer: Self-pay

## 2021-02-28 ENCOUNTER — Encounter (HOSPITAL_COMMUNITY): Payer: Self-pay | Admitting: Family Medicine

## 2021-02-28 DIAGNOSIS — R109 Unspecified abdominal pain: Secondary | ICD-10-CM | POA: Diagnosis not present

## 2021-02-28 DIAGNOSIS — O26891 Other specified pregnancy related conditions, first trimester: Secondary | ICD-10-CM | POA: Diagnosis present

## 2021-02-28 DIAGNOSIS — Z79899 Other long term (current) drug therapy: Secondary | ICD-10-CM | POA: Diagnosis not present

## 2021-02-28 DIAGNOSIS — O3680X0 Pregnancy with inconclusive fetal viability, not applicable or unspecified: Secondary | ICD-10-CM | POA: Insufficient documentation

## 2021-02-28 DIAGNOSIS — O209 Hemorrhage in early pregnancy, unspecified: Secondary | ICD-10-CM | POA: Insufficient documentation

## 2021-02-28 DIAGNOSIS — O4691 Antepartum hemorrhage, unspecified, first trimester: Secondary | ICD-10-CM

## 2021-02-28 DIAGNOSIS — O26899 Other specified pregnancy related conditions, unspecified trimester: Secondary | ICD-10-CM

## 2021-02-28 DIAGNOSIS — Z3A01 Less than 8 weeks gestation of pregnancy: Secondary | ICD-10-CM | POA: Diagnosis not present

## 2021-02-28 LAB — GC/CHLAMYDIA PROBE AMP (~~LOC~~) NOT AT ARMC
Chlamydia: NEGATIVE
Comment: NEGATIVE
Comment: NORMAL
Neisseria Gonorrhea: NEGATIVE

## 2021-02-28 NOTE — MAU Provider Note (Signed)
Chief Complaint:  Vaginal Bleeding   Event Date/Time   First Provider Initiated Contact with Patient 02/28/21 2232     HPI: Tanya Pierce is a 25 y.o. G2P1001 at [redacted]w[redacted]d who presents to maternity admissions reporting increased vaginal bleeding and abdominal pain/cramping. Pt was seen in MAU 02/25/21 for vaginal bleeding with clots and a +HPT, diagnosed with pregnancy of unknown location and sent home with ectopic precautions. Repeat quant on 02/27/21 showed a slight fall in [place], but still not definitive so told to do a repeat quant on 03/01/21. Today she began experiencing heavier vaginal bleeding with clots accompanied by waves of intense abdominal cramps. She will have intense cramping followed by passing of clots and then the cramps ease. Took 1000mg  Tylenol but it did not help. Denies any dizziness, light-headedness, nausea or vomiting.   Pregnancy Course: Receives GYN care with [facility]  Past Medical History:  Diagnosis Date  . Allergy   . Anemia   . HSV (herpes simplex virus) anogenital infection   . Ingrown toenail    OB History  Gravida Para Term Preterm AB Living  2 1 1     1   SAB IAB Ectopic Multiple Live Births        0 1    # Outcome Date GA Lbr Len/2nd Weight Sex Delivery Anes PTL Lv  2 Current           1 Term 10/06/16 [redacted]w[redacted]d  7 lb 0.7 oz (3.195 kg) M CS-Vac Spinal  LIV   Past Surgical History:  Procedure Laterality Date  . ADENOIDECTOMY  1998  . CESAREAN SECTION N/A 10/06/2016   Procedure: CESAREAN SECTION;  Surgeon: [redacted]w[redacted]d, MD;  Location: [facility];  Service: Obstetrics;  Laterality: N/A;  . EYE SURGERY  2002   eye muscle  . MYRINGOTOMY  1998   Family History  Problem Relation Age of Onset  . Hyperlipidemia Mother   . Hypertension Father   . Hyperlipidemia Maternal Grandmother   . Cancer Maternal Grandfather        throat and prostate  . Diabetes Paternal Grandmother   . Diabetes Paternal Grandfather   . Heart disease Paternal  Grandfather    Social History   Tobacco Use  . Smoking status: Never Smoker  . Smokeless tobacco: Never Used  Vaping Use  . Vaping Use: Never used  Substance Use Topics  . Alcohol use: Not Currently    Comment: none with preg  . Drug use: No   Allergies  Allergen Reactions  . Monistat [Miconazole] Swelling and Rash   Medications Prior to Admission  Medication Sig Dispense Refill Last Dose  . metroNIDAZOLE (FLAGYL) 500 MG tablet Take 1 tablet (500 mg total) by mouth 2 (two) times daily for 7 days. 14 tablet 0 02/28/2021 at Unknown time  . Prenatal Vit-Fe Fumarate-FA (PRENATAL MULTIVITAMIN) TABS tablet Take 1 tablet by mouth daily at 12 noon.   02/27/2021 at Unknown time  . gabapentin (NEURONTIN) 100 MG capsule Take 100 mg by mouth 3 (three) times daily.     04/30/2021 senna-docusate (SENOKOT-S) 8.6-50 MG tablet Take 2 tablets by mouth daily. For 7 days, then as needed 14 tablet 1    I have reviewed patient's Past Medical Hx, Surgical Hx, Family Hx, Social Hx, medications and allergies.   ROS:  Pertinent items noted in HPI and remainder of comprehensive ROS otherwise negative.  Physical Exam   Patient Vitals for the past 24 hrs:  BP Temp Pulse Resp  Height Weight  02/28/21 2025 (!) 122/56 98.1 F (36.7 C) 83 18 5\' 3"  (1.6 m) 136 lb (61.7 kg)   Constitutional: Well-developed, well-nourished female in no acute distress.  Cardiovascular: normal rate & rhythm, no murmur Respiratory: normal effort, lung sounds clear throughout GI: Abd soft, non-tender, gravid appropriate for gestational age. Pos BS x 4 MS: Extremities nontender, no edema, normal ROM Neurologic: Alert and oriented x 4.  GU: no CVA tenderness Pelvic exam deferred   Labs: hCG, Beta Chain, Quant, S <5 mIU/mL 4,528High  4,678High CM  4,814High CM    WBC 4.0 - 10.5 K/uL 12.4High  8.9   RBC 3.87 - 5.11 MIL/uL 4.11  4.15   Hemoglobin 12.0 - 15.0 g/dL  09.8   HCT 11.9 - 46.0 % 37.2  37.5   MCV 80.0 - 100.0 fL 90.5   90.4   MCH 26.0 - 34.0 pg 29.9  29.6   MCHC 30.0 - 36.0 g/dL 14.7  82.9   RDW 56.2 - 15.5 % 14.8  14.6   Platelets 150 - 400 K/uL 282  233   nRBC 0.0 - 0.2 % 0.0  0.0   Component Ref Range & Units 2 d ago  Sodium 135 - 145 mmol/L 136   Potassium 3.5 - 5.1 mmol/L 3.6   Chloride 98 - 111 mmol/L 103   CO2 22 - 32 mmol/L 21Low   Glucose, Bld 70 - 99 mg/dL 96   BUN 6 - 20 mg/dL 12   Creatinine, Ser 13.0 - 1.00 mg/dL 8.65   Calcium 8.9 - 7.84 mg/dL 9.4   Total Protein 6.5 - 8.1 g/dL 7.7   Albumin 3.5 - 5.0 g/dL 4.5   AST 15 - 41 U/L 26   ALT 0 - 44 U/L 12   Alkaline Phosphatase 38 - 126 U/L 52   Total Bilirubin 0.3 - 1.2 mg/dL 0.7   GFR, Estimated 69.6 mL/min >60   Anion gap 5 - 15 12    Imaging:  CLINICAL DATA:  Vaginal bleeding. Last menstrual period 01/08/2021. Estimated due date 10/15/2021. gestational age by ultrasound on 02/25/2021: 6 weeks. Previous C-section. EXAM: OBSTETRIC <14 WK 04/27/2021 AND TRANSVAGINAL OB US TECHNIQUE: Both transabdominal and transvaginal ultrasound examinations were performed for complete evaluation of the gestation as well as the maternal uterus, adnexal regions, and pelvic cul-de-sac. Transvaginal technique was performed to assess early pregnancy. COMPARISON:  Ultrasound pelvis 02/25/2021 FINDINGS: Intrauterine gestational sac: Single Yolk sac:  Not Visualized. Embryo:  Not Visualized. Cardiac Activity: Not Visualized. MSD: 9.9 mm   5 w   5 Subchorionic hemorrhage:  None visualized. Maternal uterus/adnexae: Bilateral ovaries are unremarkable. Ultrasound is otherwise unremarkable. Other: No free pelvic fluid. IMPRESSION: Probable early intrauterine gestational sac, but no yolk sac, fetal pole, or cardiac activity yet visualized. Recommend follow-up quantitative B-HCG levels and follow-up 04/27/2021 in 14 days to assess viability. This recommendation follows SRU consensus guidelines: Diagnostic Criteria for Nonviable Pregnancy Early in the  First Trimester. [place] Med [id number]. Electronically Signed   By: ; [id number] M.D.   On: 02/28/2021 23:38  MAU Course & MDM: Repeat HCG and TVUS ordered for comparison. Findings cannot rule out ectopic pregnancy especially given plateauing HCG levels. Reviewed findings with Dr. 04/30/2021 who recommended methotrexate therapy. CBC and CMP stable. .  The risks of methotrexate were reviewed including failure requiring repeat dosing or eventual surgery. She understands that methotrexate involves frequent return visits to monitor lab values and that she remains  at risk of ectopic rupture until her beta is less than assay. ?The patient opts to proceed with methotrexate.  She has no history of hepatic or renal dysfunction, has normal BUN/Cr/LFT's/platelets.  She is felt to be reliable for follow-up. Side effects of photosensitivity & GI upset were discussed.  She knows to avoid direct sunlight and abstain from alcohol, NSAIDs and sexual intercourse for two weeks. She was counseled to discontinue any MVI with folic acid. ?She understands to follow up on D4 and D7 at [facility] for repeat BHCG and was given the instruction sheet. ?Strict ectopic precautions were reviewed, the patient knows to call with any abdominal pain, vomiting, fainting, or any concerns with her health.  Day 0/1 Day 4 Day 7  Monday Thursday Sunday   Pt amenable to plan and has a supportive friend willing to stay with her overnight.  Assessment: Abdominal pain affecting pregnancy - Plan: Discharge patient  Vaginal bleeding in pregnancy, first trimester - Plan: US OB Transvaginal, US OB Transvaginal, Discharge patient  Pregnancy of unknown anatomic location - Plan: Discharge patient  Plan: Discharge home in stable condition with strict ectopic precautions.  Follow up lab visits scheduled at [facility] lab      Follow-up Information    [facility]. Go to.   Specialty:  Obstetrics and Gynecology Why: at 10:30am on 5/13, then 1:30pm on 5/16, and 1:30pm on 5/23 for follow up hormone levels Contact information: [address] [place] [address] [phone]             Allergies as of 03/01/2021      Reactions   Monistat [miconazole] Swelling, Rash      Medication List    TAKE these medications   gabapentin 100 MG capsule Commonly known as: NEURONTIN Take 100 mg by mouth 3 (three) times daily.   metroNIDAZOLE 500 MG tablet Commonly known as: Flagyl Take 1 tablet (500 mg total) by mouth 2 (two) times daily for 7 days.   ondansetron 4 MG disintegrating tablet Commonly known as: Zofran ODT Take 1 tablet (4 mg total) by mouth every 8 (eight) hours as needed for nausea or vomiting.   oxyCODONE-acetaminophen 5-325 MG tablet Commonly known as: PERCOCET/ROXICET Take 1 tablet by mouth every 6 (six) hours as needed for up to 5 days.   prenatal multivitamin Tabs tablet Take 1 tablet by mouth daily at 12 noon.     ASK your doctor about these medications   senna-docusate 8.6-50 MG tablet Commonly known as: Senokot-S Take 2 tablets by mouth daily. For 7 days, then as needed      Edd Arbour, CNM, MSN, [employer] Certified Nurse Midwife, [facility]

## 2021-02-28 NOTE — MAU Note (Signed)
Pt reprot she was supposed to come back tomorrow for repeat lab work. Still having some vag bleeding that has increased  And abd pain has gotten worse took tylenol without much relief. Was told she may have an ectopic pregnancy and if pain got worse to come back.

## 2021-03-01 ENCOUNTER — Other Ambulatory Visit: Payer: No Typology Code available for payment source

## 2021-03-01 DIAGNOSIS — O26891 Other specified pregnancy related conditions, first trimester: Secondary | ICD-10-CM

## 2021-03-01 DIAGNOSIS — O209 Hemorrhage in early pregnancy, unspecified: Secondary | ICD-10-CM

## 2021-03-01 DIAGNOSIS — R109 Unspecified abdominal pain: Secondary | ICD-10-CM

## 2021-03-01 DIAGNOSIS — O3680X0 Pregnancy with inconclusive fetal viability, not applicable or unspecified: Secondary | ICD-10-CM

## 2021-03-01 LAB — COMPREHENSIVE METABOLIC PANEL
ALT: 12 U/L (ref 0–44)
AST: 26 U/L (ref 15–41)
Albumin: 4.5 g/dL (ref 3.5–5.0)
Alkaline Phosphatase: 52 U/L (ref 38–126)
Anion gap: 12 (ref 5–15)
BUN: 12 mg/dL (ref 6–20)
CO2: 21 mmol/L — ABNORMAL LOW (ref 22–32)
Calcium: 9.4 mg/dL (ref 8.9–10.3)
Chloride: 103 mmol/L (ref 98–111)
Creatinine, Ser: 0.76 mg/dL (ref 0.44–1.00)
GFR, Estimated: >60 mL/min (ref >60)
Glucose, Bld: 96 mg/dL (ref 70–99)
Potassium: 3.6 mmol/L (ref 3.5–5.1)
Sodium: 136 mmol/L (ref 135–145)
Total Bilirubin: 0.7 mg/dL (ref 0.3–1.2)
Total Protein: 7.7 g/dL (ref 6.5–8.1)

## 2021-03-01 LAB — HCG, QUANTITATIVE, PREGNANCY: hCG, Beta Chain, Quant, S: 4528 m[IU]/mL — ABNORMAL HIGH (ref <5)

## 2021-03-01 LAB — CBC
HCT: 37.2 % (ref 36.0–46.0)
Hemoglobin: 12.3 g/dL (ref 12.0–15.0)
MCH: 29.9 pg (ref 26.0–34.0)
MCHC: 33.1 g/dL (ref 30.0–36.0)
MCV: 90.5 fL (ref 80.0–100.0)
Platelets: 282 10*3/uL (ref 150–400)
RBC: 4.11 MIL/uL (ref 3.87–5.11)
RDW: 14.8 % (ref 11.5–15.5)
WBC: 12.4 10*3/uL — ABNORMAL HIGH (ref 4.0–10.5)
nRBC: 0 % (ref 0.0–0.2)

## 2021-03-01 MED ORDER — ONDANSETRON 4 MG PO TBDP: 4.0000 mg | ORAL_TABLET | Freq: Three times a day (TID) | ORAL | 0 refills | Status: DC | PRN

## 2021-03-01 MED ORDER — METHOTREXATE FOR ECTOPIC PREGNANCY
50.0000 mg/m2 | Freq: Once | INTRAMUSCULAR | Status: AC
  Administered 2021-03-01: 83 mg via INTRAMUSCULAR
  Filled 2021-03-01: qty 3.32

## 2021-03-01 MED ORDER — OXYCODONE-ACETAMINOPHEN 5-325 MG PO TABS: 1.0000 | ORAL_TABLET | Freq: Four times a day (QID) | ORAL | 0 refills | Status: AC | PRN

## 2021-03-01 NOTE — Discharge Instructions (Signed)
Methotrexate Treatment for an Ectopic Pregnancy Methotrexate is a medicine that treats an ectopic pregnancy. In this type of pregnancy, the fertilized egg attaches (implants) outside the uterus. An ectopic pregnancy cannot develop into a healthy baby. Methotrexate works by stopping the growth of the fertilized egg. It also helps the body absorb tissue from the egg. This takes about 2-6 weeks. An ectopic pregnancy can be life-threatening. However, most ectopic pregnancies can be successfully treated with methotrexate if they are diagnosed early. Tell a health care provider about:  Any allergies you have.  All medicines you are taking, including vitamins, herbs, eye drops, creams, and over-the-counter medicines.  Any medical conditions you have. What are the risks? Generally, this is a safe treatment. However, problems may occur, including:  Digestive problems. You may have: ? Nausea. ? Vomiting. ? Diarrhea. ? Cramping in your abdomen.  Bleeding or spotting from your vagina.  Feeling dizzy or light-headed.  Mouth sores.  Inflammation of the lining of your lungs (pneumonitis).  Damage to nearby structures or organs, such as damage to the liver.  Hair loss. There is a risk that methotrexate treatment will fail and the pregnancy will continue. There is also a risk that the ectopic pregnancy might tear or burst (rupture) during use of this medicine. What happens before the procedure?  Blood tests will be done to check how your disease-fighting system (immune system), liver, and kidneys are working.  You will also have blood tests to measure your pregnancy hormone levels and to find out your blood type.  You will be given a shot of a medicine called Rho(D) immune globulin if: ? You are Rh-negative and the father is Rh-positive. ? You are Rh-negative and the father's Rh type is unknown. What happens during the procedure?  Methotrexate will be injected into your  muscle. ? Methotrexate may be given as a single dose of medicine or a series of doses over time, depending on your response to the treatment. ? Methotrexate injections are given by a health care provider. Injection is the most common way that this medicine is used to treat an ectopic pregnancy.  You may also receive other medicines to manage your ectopic pregnancy. The procedure may vary among health care providers and hospitals. What can I expect after treatment? After your treatment, it is common to have:  Cramping in your abdomen.  Bleeding in your vagina.  Tiredness (fatigue).  Nausea.  Vomiting.  Diarrhea. Blood tests will be done at timed intervals for several days or weeks to check your pregnancy hormone levels. The blood tests will be done until the pregnancy hormone can no longer be found in the blood. If the methotrexate treatment does not work, a surgical procedure may be done to remove the ectopic pregnancy. Follow these instructions at home: Medicines  Take over-the-counter and prescription medicines only as told by your health care provider.  Do not take prescription pain medicines, aspirin, ibuprofen, naproxen, or any other NSAIDs.  Do not take folic acid, prenatal vitamins, or other vitamins that contain folic acid. Activity  Do not have sex, douche, or put anything, such as tampons, in your vagina until your health care provider says it is okay.  Limit activities that take a lot of effort as told by your health care provider. General instructions  Do not drink alcohol.  Follow instructions from your health care provider about eating restrictions, such as avoiding foods that produce a lot of gas. These foods can hide the signs of a   ruptured ectopic pregnancy.  Limit exposure to sunlight or artificial UV light such as from tanning beds. Methotrexate can make you more sensitive to the sun.  Follow instructions from your health care provider on how and when to  report any symptoms that may indicate a ruptured ectopic pregnancy.  Keep all follow-up visits. This is important.   Contact a health care provider if:  You have persistent nausea and vomiting.  You have persistent diarrhea.  You are having a reaction to the medicine. This may include: ? Unusual fatigue. ? Skin rash. Get help right away if:  Pain in your abdomen or in the area between your hip bones (pelvic area) gets worse.  You have more bleeding from your vagina.  You feel light-headed or you faint.  You are short of breath.  Your heart rate increases.  You develop a cough.  You have chills or a fever. Summary  Methotrexate is a medicine that treats an ectopic pregnancy. This type of pregnancy forms outside the uterus.  There is a risk that methotrexate treatment will fail and the pregnancy will continue. There is also a risk that the ectopic pregnancy might tear or burst during use of this medicine.  This medicine may be given in a single dose or a series of doses over time.  After your treatment, blood tests will be done at timed intervals for several days or weeks to check your pregnancy hormone levels. The blood tests will be done until no more pregnancy hormone is found in the blood. This information is not intended to replace advice given to you by your health care provider. Make sure you discuss any questions you have with your health care provider. Document Revised: 03/24/2020 Document Reviewed: 03/24/2020 Elsevier Patient Education  2021 Elsevier Inc.  

## 2021-03-03 ENCOUNTER — Other Ambulatory Visit: Payer: Self-pay | Admitting: *Deleted

## 2021-03-03 DIAGNOSIS — O3680X0 Pregnancy with inconclusive fetal viability, not applicable or unspecified: Secondary | ICD-10-CM

## 2021-03-03 DIAGNOSIS — O209 Hemorrhage in early pregnancy, unspecified: Secondary | ICD-10-CM

## 2021-03-07 ENCOUNTER — Other Ambulatory Visit: Payer: Medicaid Other

## 2021-03-07 ENCOUNTER — Other Ambulatory Visit: Payer: Self-pay

## 2021-03-07 DIAGNOSIS — O3680X0 Pregnancy with inconclusive fetal viability, not applicable or unspecified: Secondary | ICD-10-CM

## 2021-03-07 DIAGNOSIS — O209 Hemorrhage in early pregnancy, unspecified: Secondary | ICD-10-CM

## 2021-03-08 LAB — BETA HCG QUANT (REF LAB): hCG Quant: 149 m[IU]/mL

## 2021-03-14 ENCOUNTER — Other Ambulatory Visit: Payer: Self-pay

## 2021-03-14 ENCOUNTER — Other Ambulatory Visit: Payer: Self-pay | Admitting: General Practice

## 2021-03-14 ENCOUNTER — Other Ambulatory Visit: Payer: Medicaid Other

## 2021-03-14 DIAGNOSIS — O3680X0 Pregnancy with inconclusive fetal viability, not applicable or unspecified: Secondary | ICD-10-CM

## 2021-03-15 LAB — BETA HCG QUANT (REF LAB): hCG Quant: 24 m[IU]/mL

## 2021-11-09 ENCOUNTER — Other Ambulatory Visit: Payer: Self-pay | Admitting: Obstetrics & Gynecology

## 2022-02-27 ENCOUNTER — Encounter: Payer: Self-pay | Admitting: Cardiology

## 2022-02-27 ENCOUNTER — Ambulatory Visit (INDEPENDENT_AMBULATORY_CARE_PROVIDER_SITE_OTHER): Payer: Medicaid Other | Admitting: Cardiology

## 2022-02-27 ENCOUNTER — Ambulatory Visit (INDEPENDENT_AMBULATORY_CARE_PROVIDER_SITE_OTHER): Payer: Medicaid Other

## 2022-02-27 VITALS — BP 96/56 | HR 63 | Ht 63.0 in | Wt 141.6 lb

## 2022-02-27 DIAGNOSIS — R42 Dizziness and giddiness: Secondary | ICD-10-CM | POA: Diagnosis not present

## 2022-02-27 DIAGNOSIS — R55 Syncope and collapse: Secondary | ICD-10-CM

## 2022-02-27 DIAGNOSIS — R011 Cardiac murmur, unspecified: Secondary | ICD-10-CM | POA: Diagnosis not present

## 2022-02-27 NOTE — Patient Instructions (Addendum)
Medication Instructions:  ?Your physician recommends that you continue on your current medications as directed. Please refer to the Current Medication list given to you today.  ? ?Please take your blood pressure daily for 2 weeks and send in a MyChart message. Please include heart rates.  ? ?HOW TO TAKE YOUR BLOOD PRESSURE: ?Rest 5 minutes before taking your blood pressure. ?Don?t smoke or drink caffeinated beverages for at least 30 minutes before. ?Take your blood pressure before (not after) you eat. ?Sit comfortably with your back supported and both feet on the floor (don?t cross your legs). ?Elevate your arm to heart level on a table or a desk. ?Use the proper sized cuff. It should fit smoothly and snugly around your bare upper arm. There should be enough room to slip a fingertip under the cuff. The bottom edge of the cuff should be 1 inch above the crease of the elbow. ?Ideally, take 3 measurements at one sitting and record the average. ? ?*If you need a refill on your cardiac medications before your next appointment, please call your pharmacy* ? ? ?Lab Work: ?None ?If you have labs (blood work) drawn today and your tests are completely normal, you will receive your results only by: ?MyChart Message (if you have MyChart) OR ?A paper copy in the mail ?If you have any lab test that is abnormal or we need to change your treatment, we will call you to review the results. ? ? ?Testing/Procedures: ?Your physician has requested that you have an echocardiogram. Echocardiography is a painless test that uses sound waves to create images of your heart. It provides your doctor with information about the size and shape of your heart and how well your heart?s chambers and valves are working. This procedure takes approximately one hour. There are no restrictions for this procedure. ? ?ZIO AT Long term monitor-Live Telemetry ? ?Your physician has requested you wear a ZIO patch monitor for 7 days.  ?This is a single patch  monitor. [employer] supplies one patch monitor per enrollment. Additional  ?stickers are not available.  ?Please do not apply patch if you will be having a Nuclear Stress Test, Echocardiogram, Cardiac CT, MRI,  ?or Chest Xray during the period you would be wearing the monitor. The patch cannot be worn during  ?these tests. You cannot remove and re-apply the ZIO AT patch monitor.  ?Your ZIO patch monitor will be mailed 3 day USPS to your address on file. It may take 3-5 days to  ?receive your monitor after you have been enrolled.  ?Once you have received your monitor, please review the enclosed instructions. Your monitor has  ?already been registered assigning a specific monitor serial # to you.  ? ?Billing and Patient Assistance Program information ? ?[employer] has been supplied with any insurance information on record for billing. ?[employer] offers a sliding scale Patient Assistance Program for patients without insurance, or whose  ?insurance does not completely cover the cost of the ZIO patch monitor. You must apply for the  ?Patient Assistance Program to qualify for the discounted rate. To apply, call [employer] at [phone],  ?select option 4, select option 2 , ask to apply for the Patient Assistance Program, (you can request an  ?interpreter if needed). [employer] will ask your household income and how many people are in your  ?household. [employer] will quote your out-of-pocket cost based on this information. They will also be able  ?to set up a 12 month interest free payment plan if needed. ? ?  Applying the monitor  ? ?Shave hair from upper left chest.  ?Hold the abrader disc by orange tab. Rub the abrader in 40 strokes over left upper chest as indicated in  ?your monitor instructions.  ?Clean area with 4 enclosed alcohol pads. Use all pads to ensure the area is cleaned thoroughly. Let  ?dry.  ?Apply patch as indicated in monitor instructions. Patch will be placed under collarbone on left side of  ?chest with arrow  pointing upward.  ?Rub patch adhesive wings for 2 minutes. Remove the white label marked "1". Remove the white label  ?marked "2". Rub patch adhesive wings for 2 additional minutes.  ?While looking in a mirror, press and release button in center of patch. A small green light will flash 3-4  ?times. This will be your only indicator that the monitor has been turned on.  ?Do not shower for the first 24 hours. You may shower after the first 24 hours.  ?Press the button if you feel a symptom. You will hear a small click. Record Date, Time and Symptom in  ?the Patient Log.  ? ?Starting the Gateway ? ?In your kit there is a small plastic box the size of a cellphone. This is Airline pilot. It transmits all your  ?recorded data to [employer]. This box must always stay within 10 feet of you. Open the box and push the *  ?button. There will be a light that blinks orange and then green a few times. When the light stops  ?blinking, the Gateway is connected to the ZIO patch. ?Call [employer] at [phone] to confirm your monitor is transmitting. ? ?Returning your monitor ? ?Remove your patch and place it inside the Gateway. In the lower half of the Gateway there is a white  ?bag with prepaid postage on it. Place Gateway in bag and seal. Mail package back to [facility] as soon as  ?possible. Your physician should have your final report approximately 7 days after you have mailed back  ?your monitor. ?Call [facility] at [phone] if you have questions regarding your ZIO AT  ?patch monitor. Call them immediately if you see an orange light blinking on your monitor.  ?If your monitor falls off in less than 4 days, contact our Monitor department at [phone]. If your  ?monitor becomes loose or falls off after 4 days call [employer] at [phone] for suggestions on  ?securing your monitor ? ? ? ?Follow-Up: ?At [facility], you and your health needs are our priority.  As part of our continuing mission to  provide you with exceptional heart care, we have created designated Provider Care Teams.  These Care Teams include your primary Cardiologist (physician) and Advanced Practice Providers (APPs -  Physician Assistants and Nurse Practitioners) who all work together to provide you with the care you need, when you need it. ? ?We recommend signing up for the patient portal called "MyChart".  Sign up information is provided on this After Visit Summary.  MyChart is used to connect with patients for Virtual Visits (Telemedicine).  Patients are able to view lab/test results, encounter notes, upcoming appointments, etc.  Non-urgent messages can be sent to your provider as well.   ?To learn more about what you can do with MyChart, go to [email or web address].   ? ?Your next appointment:   ?16 week(s) ? ?The format for your next appointment:   ?In Person ? ?Provider:   ?Berniece Salines, DO   ? ? ?Other Instructions ? ? ?Important  Information About Sugar ? ? ? ? ?  ?

## 2022-02-27 NOTE — Progress Notes (Unsigned)
[id number] ZIO AT from office inventory applied to patient. ?

## 2022-02-27 NOTE — Progress Notes (Signed)
?[facility] ? ?New Evaluation ? ?Date:  02/28/2022  ? ?ID:  Tanya Pierce, DOB [birth year], MRN [id number] ? ?PCP:  Glendon Axe, MD ?  ?[facility] Providers ?Cardiologist:  Berniece Salines, DO  ?Electrophysiologist:  None      ? ?Referring MD: Donnel Saxon, CNM  ? ?Chief Complaint: " I am having lightheadedness" ? ?History of Present Illness:   ? ?Tanya Pierce is a 26 y.o. female [G3P1001] who is being seen today for the evaluation of lightheadedness and dizziness at the request of Donnel Saxon, CNM.  ? ?She tells me that she has been experiencing intermittent lightheadedness and dizziness usually when she is doing activity.  She described this as sometimes she feels as if the voices are fading away and her head gets significantly light and her face does not appear to same.  She notes that this happened last summer when this started happening again with this pregnancy. ? ?No chest pain. ? ?Prior CV Studies Reviewed: ?The following studies were reviewed today: ? ? ?Past Medical History:  ?Diagnosis Date  ? Allergy   ? Anemia   ? HSV (herpes simplex virus) anogenital infection   ? Ingrown toenail   ? ? ?Past Surgical History:  ?Procedure Laterality Date  ? ADENOIDECTOMY  1998  ? CESAREAN SECTION N/A 10/06/2016  ? Procedure: CESAREAN SECTION;  Surgeon: Waymon Amato, MD;  Location: [place];  Service: Obstetrics;  Laterality: N/A;  ? EYE SURGERY  2002  ? eye muscle  ? MYRINGOTOMY  1998  ?   ? ?OB History   ? ? Gravida  ?3  ? Para  ?1  ? Term  ?1  ? Preterm  ?   ? AB  ?   ? Living  ?1  ?  ? ? SAB  ?   ? IAB  ?   ? Ectopic  ?   ? Multiple  ?0  ? Live Births  ?1  ?   ?  ?  ?    ? ? ?Current Medications: ?Current Meds  ?Medication Sig  ? cholecalciferol (VITAMIN D3) 25 MCG (1000 UNIT) tablet Take 1,000 Units by mouth daily.  ? Prenatal Vit-Fe Fumarate-FA (PRENATAL MULTIVITAMIN) TABS tablet Take 1 tablet by mouth daily at 12 noon.  ?  ? ?Allergies:   Gabapentin and Monistat [miconazole]  ? ?Social  History  ? ?Socioeconomic History  ? Marital status: Single  ?  Spouse name: Not on file  ? Number of children: Not on file  ? Years of education: Not on file  ? Highest education level: Not on file  ?Occupational History  ? Not on file  ?Tobacco Use  ? Smoking status: Never  ? Smokeless tobacco: Never  ?Vaping Use  ? Vaping Use: Never used  ?Substance and Sexual Activity  ? Alcohol use: Not Currently  ?  Comment: none with preg  ? Drug use: No  ? Sexual activity: Never  ?  Birth control/protection: None  ?Other Topics Concern  ? Not on file  ?Social History Narrative  ? Not on file  ? ?Social Determinants of Health  ? ?Financial Resource Strain: Not on file  ?Food Insecurity: Not on file  ?Transportation Needs: No Transportation Needs  ? Lack of Transportation (Medical): No  ? Lack of Transportation (Non-Medical): No  ?Physical Activity: Not on file  ?Stress: Not on file  ?Social Connections: Not on file  ?  ? ? ?Family History  ?Problem Relation Age of Onset  ?  Hyperlipidemia Mother   ? Hypertension Father   ? Hyperlipidemia Maternal Grandmother   ? Cancer Maternal Grandfather   ?     throat and prostate  ? Diabetes Paternal Grandmother   ? Diabetes Paternal Grandfather   ? Heart disease Paternal Grandfather   ?   ? ?ROS:   ?Review of Systems  ?Constitution: Reports lightheadedness.  Negative for decreased appetite, fever and weight gain.  ?HENT: Negative for congestion, ear discharge, hoarse voice and sore throat.   ?Eyes: Negative for discharge, redness, vision loss in right eye and visual halos.  ?Cardiovascular: Negative for chest pain, dyspnea on exertion, leg swelling, orthopnea and palpitations.  ?Respiratory: Negative for cough, hemoptysis, shortness of breath and snoring.   ?Endocrine: Negative for heat intolerance and polyphagia.  ?Hematologic/Lymphatic: Negative for bleeding problem. Does not bruise/bleed easily.  ?Skin: Negative for flushing, nail changes, rash and suspicious lesions.   ?Musculoskeletal: Negative for arthritis, joint pain, muscle cramps, myalgias, neck pain and stiffness.  ?Gastrointestinal: Negative for abdominal pain, bowel incontinence, diarrhea and excessive appetite.  ?Genitourinary: Negative for decreased libido, genital sores and incomplete emptying.  ?Neurological: Negative for brief paralysis, focal weakness, headaches and loss of balance.  ?Psychiatric/Behavioral: Negative for altered mental status, depression and suicidal ideas.  ?Allergic/Immunologic: Negative for HIV exposure and persistent infections.  ? ? ? ?Labs/EKG Reviewed:   ? ?EKG:   ?EKG is was ordered today.  The ekg ordered today demonstrates sinus rhythm, heart rate 63 beats minute. ? ?Recent Labs: ?02/28/2021: ALT 12; BUN 12; Creatinine, Ser 0.76; Hemoglobin 12.3; Platelets 282; Potassium 3.6; Sodium 136  ? ?Recent Lipid Panel ?No results found for: CHOL, TRIG, HDL, CHOLHDL, LDLCALC, LDLDIRECT ? ?Physical Exam:   ? ?VS:  BP (!) 96/56 (BP Location: Left Arm, Patient Position: Sitting, Cuff Size: Normal)   Pulse 63   Ht 5\' 3"  (1.6 m)   Wt 141 lb 9.6 oz (64.2 kg)   LMP 10/27/2021   SpO2 98%   Breastfeeding No   BMI 25.08 kg/m?    ? ?Wt Readings from Last 3 Encounters:  ?02/27/22 141 lb 9.6 oz (64.2 kg)  ?02/28/21 136 lb (61.7 kg)  ?02/25/21 134 lb 9.6 oz (61.1 kg)  ?  ? ?GEN:  Well nourished, well developed in no acute distress ?HEENT: Normal ?NECK: No JVD; No carotid bruits ?LYMPHATICS: No lymphadenopathy ?CARDIAC: RRR, 3/6 holosystolic murmurs, rubs, gallops ?RESPIRATORY:  Clear to auscultation without rales, wheezing or rhonchi  ?ABDOMEN: Soft, non-tender, non-distended ?MUSCULOSKELETAL:  No edema; No deformity  ?SKIN: Warm and dry ?NEUROLOGIC:  Alert and oriented x 3 ?PSYCHIATRIC:  Normal affect  ? ? ?Risk Assessment/Risk Calculators:   ?  ?CARPREG II ?Risk Prediction Index Score:  1.  The patient's risk for a primary cardiac event is 5%. ?  ?   ?  ?  ? ? ?ASSESSMENT & PLAN:   ? ?Presyncope ?Cardiac  murmur ? ?I would like to rule out a cardiovascular etiology of this presyncope, therefore at this time I would like to placed a zio patch for  14  days. In additon with her cardiac murmur which does not sound like the normal pregnancy physiologic murmur a transthoracic echocardiogram will be ordered to assess LV/RV function and any structural abnormalities. Once these testing have been performed amd reviewed further reccomendations will be made. For now, I do reccomend that the patient goes to the nearest ED if  symptoms recur. ? ? ?Patient Instructions  ?Medication Instructions:  ?Your physician recommends that  you continue on your current medications as directed. Please refer to the Current Medication list given to you today.  ? ?Please take your blood pressure daily for 2 weeks and send in a MyChart message. Please include heart rates.  ? ?HOW TO TAKE YOUR BLOOD PRESSURE: ?Rest 5 minutes before taking your blood pressure. ?Don?t smoke or drink caffeinated beverages for at least 30 minutes before. ?Take your blood pressure before (not after) you eat. ?Sit comfortably with your back supported and both feet on the floor (don?t cross your legs). ?Elevate your arm to heart level on a table or a desk. ?Use the proper sized cuff. It should fit smoothly and snugly around your bare upper arm. There should be enough room to slip a fingertip under the cuff. The bottom edge of the cuff should be 1 inch above the crease of the elbow. ?Ideally, take 3 measurements at one sitting and record the average. ? ?*If you need a refill on your cardiac medications before your next appointment, please call your pharmacy* ? ? ?Lab Work: ?None ?If you have labs (blood work) drawn today and your tests are completely normal, you will receive your results only by: ?MyChart Message (if you have MyChart) OR ?A paper copy in the mail ?If you have any lab test that is abnormal or we need to change your treatment, we will call you to review the  results. ? ? ?Testing/Procedures: ?Your physician has requested that you have an echocardiogram. Echocardiography is a painless test that uses sound waves to create images of your heart. It provides your doctor with information a

## 2022-03-07 ENCOUNTER — Ambulatory Visit (HOSPITAL_COMMUNITY): Payer: Medicaid Other | Attending: Internal Medicine

## 2022-03-07 DIAGNOSIS — R42 Dizziness and giddiness: Secondary | ICD-10-CM | POA: Insufficient documentation

## 2022-03-07 DIAGNOSIS — R55 Syncope and collapse: Secondary | ICD-10-CM | POA: Diagnosis not present

## 2022-03-07 LAB — ECHOCARDIOGRAM COMPLETE
Area-P 1/2: 3.85 cm2
S' Lateral: 2.5 cm

## 2022-06-28 ENCOUNTER — Ambulatory Visit: Payer: Medicaid Other | Attending: Cardiology | Admitting: Cardiology

## 2022-06-28 ENCOUNTER — Encounter: Payer: Self-pay | Admitting: Cardiology

## 2022-06-28 VITALS — BP 98/60 | HR 72 | Ht 63.0 in | Wt 157.6 lb

## 2022-06-28 DIAGNOSIS — R42 Dizziness and giddiness: Secondary | ICD-10-CM | POA: Diagnosis not present

## 2022-06-28 DIAGNOSIS — Z3A35 35 weeks gestation of pregnancy: Secondary | ICD-10-CM

## 2022-06-28 NOTE — Patient Instructions (Addendum)
Medication Instructions:  Your physician recommends that you continue on your current medications as directed. Please refer to the Current Medication list given to you today.  *If you need a refill on your cardiac medications before your next appointment, please call your pharmacy*   Lab Work: None   Testing/Procedures: None   Follow-Up: At [facility], you and your health needs are our priority.  As part of our continuing mission to provide you with exceptional heart care, we have created designated Provider Care Teams.  These Care Teams include your primary Cardiologist (physician) and Advanced Practice Providers (APPs -  Physician Assistants and Nurse Practitioners) who all work together to provide you with the care you need, when you need it.  We recommend signing up for the patient portal called "MyChart".  Sign up information is provided on this After Visit Summary.  MyChart is used to connect with patients for Virtual Visits (Telemedicine).  Patients are able to view lab/test results, encounter notes, upcoming appointments, etc.  Non-urgent messages can be sent to your provider as well.   To learn more about what you can do with MyChart, go to [email or web address].    Your next appointment:   16 week(s)  The format for your next appointment:   Virtual Visit   Provider:   Thomasene Ripple, DO

## 2022-06-29 NOTE — Progress Notes (Signed)
Cardio-Obstetrics Clinic  Follow Up Note   Date:  06/29/2022   ID:  Tanya Pierce, DOB 06/27/1996, MRN [id number]  PCP:  [name], MD   [facility] Providers Cardiologist:  Thomasene Ripple, DO  Electrophysiologist:  None        Referring MD: [name], MD   Chief Complaint: " I am doing fine"  History of Present Illness:    Tanya Pierce is a 26 y.o. female [G3P1001] who returns for follow up for cardiovascular care in pregnancy.  First saw the patient on Feb 27, 2022 at that time she was history of significant lightheaded dizziness.  We will place a monitor on the patient as well as get an echocardiogram.  She was able to get her testing done there all unremarkable.   Prior CV Studies Reviewed: The following studies were reviewed today: Zio 02/2022 Patch Wear Time:  7 days and 21 hours (2023-05-08T12:40:52-0400 to 2023-05-16T10:36:27-0400)   Patient had a min HR of 52 bpm, max HR of 168 bpm, and avg HR of 78 bpm. Predominant underlying rhythm was Sinus Rhythm. Isolated SVEs were rare (<1.0%), SVE Couplets were rare (<1.0%), and no SVE Triplets were present. Isolated VEs were rare (<1.0%),  and no VE Couplets or VE Triplets were present.    Symptoms associated with sinus tachycardia.   Conclusion: Normal unremarkable study with no evidence of significant arrhythmia. 03/07/2022 IMPRESSIONS   1. Left ventricular ejection fraction, by estimation, is 60 to 65%. The  left ventricle has normal function. The left ventricle has no regional  wall motion abnormalities. Left ventricular diastolic parameters were  normal.   2. Right ventricular systolic function is normal. The right ventricular  size is normal. There is normal pulmonary artery systolic pressure. The  estimated right ventricular systolic pressure is 21.0 mmHg.   3. The mitral valve is grossly normal. Trivial mitral valve  regurgitation. No evidence of mitral stenosis.   4. The aortic valve is tricuspid.  Aortic valve regurgitation is not  visualized. No aortic stenosis is present.   5. The inferior vena cava is normal in size with greater than 50%  respiratory variability, suggesting right atrial pressure of 3 mmHg.   Comparison(s): No prior Echocardiogram.   FINDINGS   Left Ventricle: Left ventricular ejection fraction, by estimation, is 60  to 65%. The left ventricle has normal function. The left ventricle has no  regional wall motion abnormalities. The left ventricular internal cavity  size was normal in size. There is   no left ventricular hypertrophy. Left ventricular diastolic parameters  were normal.   Right Ventricle: The right ventricular size is normal. No increase in  right ventricular wall thickness. Right ventricular systolic function is  normal. There is normal pulmonary artery systolic pressure. The tricuspid  regurgitant velocity is 2.12 m/s, and   with an assumed right atrial pressure of 3 mmHg, the estimated right  ventricular systolic pressure is 21.0 mmHg.   Left Atrium: Left atrial size was normal in size.   Right Atrium: Right atrial size was normal in size.   Pericardium: There is no evidence of pericardial effusion.   Mitral Valve: The mitral valve is grossly normal. Trivial mitral valve  regurgitation. No evidence of mitral valve stenosis.   Tricuspid Valve: The tricuspid valve is grossly normal. Tricuspid valve  regurgitation is trivial. No evidence of tricuspid stenosis.   Aortic Valve: The aortic valve is tricuspid. Aortic valve regurgitation is  not visualized. No aortic stenosis is present.  Pulmonic Valve: The pulmonic valve was normal in structure. Pulmonic valve  regurgitation is not visualized. No evidence of pulmonic stenosis.   Aorta: The aortic root and ascending aorta are structurally normal, with  no evidence of dilitation.   Venous: The inferior vena cava is normal in size with greater than 50%  respiratory variability, suggesting  right atrial pressure of 3 mmHg.   IAS/Shunts: No atrial level shunt detected by color flow Doppler.    Past Medical History:  Diagnosis Date   Allergy    Anemia    HSV (herpes simplex virus) anogenital infection    Ingrown toenail     Past Surgical History:  Procedure Laterality Date   ADENOIDECTOMY  1998   CESAREAN SECTION N/A 10/06/2016   Procedure: CESAREAN SECTION;  Surgeon: Hoover Browns, MD;  [facility]: [facility];  Service: Obstetrics;  Laterality: N/A;   EYE SURGERY  2002   eye muscle   MYRINGOTOMY  1998      OB History     Gravida  3   Para  1   Term  1   Preterm      AB      Living  1      SAB      IAB      Ectopic      Multiple  0   Live Births  1               Current Medications: Current Meds  Medication Sig   cholecalciferol (VITAMIN D3) 25 MCG (1000 UNIT) tablet Take 1,000 Units by mouth daily.   Prenatal Vit-Fe Fumarate-FA (PRENATAL MULTIVITAMIN) TABS tablet Take 1 tablet by mouth daily at 12 noon.     Allergies:   Gabapentin and Monistat [miconazole]   Social History   Socioeconomic History   Marital status: Single    Spouse name: Not on file   Number of children: Not on file   Years of education: Not on file   Highest education level: Not on file  Occupational History   Not on file  Tobacco Use   Smoking status: Never   Smokeless tobacco: Never  Vaping Use   Vaping Use: Never used  Substance and Sexual Activity   Alcohol use: Not Currently    Comment: none with preg   Drug use: No   Sexual activity: Never    Birth control/protection: None  Other Topics Concern   Not on file  Social History Narrative   Not on file   Social Determinants of Health   Financial Resource Strain: Not on file  Food Insecurity: Not on file  Transportation Needs: No Transportation Needs (02/27/2022)   PRAPARE - Administrator, Civil Service (Medical): No    Lack of Transportation (Non-Medical): No  Physical  Activity: Not on file  Stress: Not on file  Social Connections: Not on file      Family History  Problem Relation Age of Onset   Hyperlipidemia Mother    Hypertension Father    Hyperlipidemia Maternal Grandmother    Cancer Maternal Grandfather        throat and prostate   Diabetes Paternal Grandmother    Diabetes Paternal Grandfather    Heart disease Paternal Grandfather       ROS:   Please see the history of present illness.     All other systems reviewed and are negative.   Labs/EKG Reviewed:    EKG:   EKG is was not  ordered today.    Recent Labs: No results found for requested labs within last 365 days.   Recent Lipid Panel No results found for: "CHOL", "TRIG", "HDL", "CHOLHDL", "LDLCALC", "LDLDIRECT"  Physical Exam:    VS:  BP 98/60   Pulse 72   Ht 5\' 3"  (1.6 m)   Wt 157 lb 9.6 oz (71.5 kg)   LMP 10/27/2021   SpO2 100%   BMI 27.92 kg/m     Wt Readings from Last 3 Encounters:  06/28/22 157 lb 9.6 oz (71.5 kg)  02/27/22 141 lb 9.6 oz (64.2 kg)  02/28/21 136 lb (61.7 kg)     GEN:  Well nourished, well developed in no acute distress HEENT: Normal NECK: No JVD; No carotid bruits LYMPHATICS: No lymphadenopathy CARDIAC: RRR, no murmurs, rubs, gallops RESPIRATORY:  Clear to auscultation without rales, wheezing or rhonchi  ABDOMEN: Soft, non-tender, non-distended MUSCULOSKELETAL:  No edema; No deformity  SKIN: Warm and dry NEUROLOGIC:  Alert and oriented x 3 PSYCHIATRIC:  Normal affect    Risk Assessment/Risk Calculators:     CARPREG II Risk Prediction Index Score:  1.  The patient's risk for a primary cardiac event is 5%.            ASSESSMENT & PLAN:    Dizziness-her symptoms had completely resolved.  I am happy for the patient.  She is taking more fluid intake now.  Blood pressure is still on the lower end but she is not symptomatic.  All of her testings were normal.  Patient Instructions  Medication Instructions:  Your physician  recommends that you continue on your current medications as directed. Please refer to the Current Medication list given to you today.  *If you need a refill on your cardiac medications before your next appointment, please call your pharmacy*   Lab Work: None   Testing/Procedures: None   Follow-Up: At [facility], you and your health needs are our priority.  As part of our continuing mission to provide you with exceptional heart care, we have created designated Provider Care Teams.  These Care Teams include your primary Cardiologist (physician) and Advanced Practice Providers (APPs -  Physician Assistants and Nurse Practitioners) who all work together to provide you with the care you need, when you need it.  We recommend signing up for the patient portal called "MyChart".  Sign up information is provided on this After Visit Summary.  MyChart is used to connect with patients for Virtual Visits (Telemedicine).  Patients are able to view lab/test results, encounter notes, upcoming appointments, etc.  Non-urgent messages can be sent to your provider as well.   To learn more about what you can do with MyChart, go to [facility].    Your next appointment:   16 week(s)  The format for your next appointment:   Virtual Visit   Provider:   [email or web address], DO    Dispo:  Return in about 16 weeks (around 10/18/2022).   Medication Adjustments/Labs and Tests Ordered: Current medicines are reviewed at length with the patient today.  Concerns regarding medicines are outlined above.  Tests Ordered: No orders of the defined types were placed in this encounter.  Medication Changes: No orders of the defined types were placed in this encounter.

## 2022-07-05 IMAGING — US US EXTREM LOW VENOUS*R*
2 series · 13 of 24 positions shown · non-contrast
Comparison: None.

CLINICAL DATA: 23-year-old with palpable knot in the right calf.



[Series 1: us extrem low venous*right* · 11 of 51 slices shown (1 of 2)]
[im 1/51]
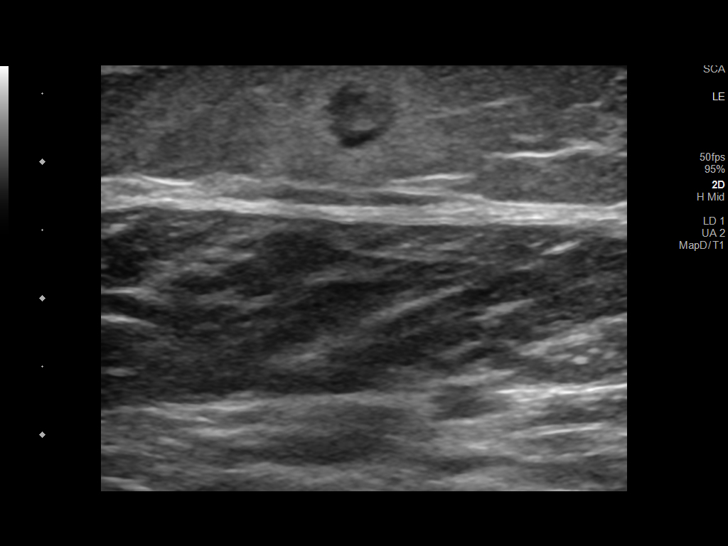
[im 6/51]
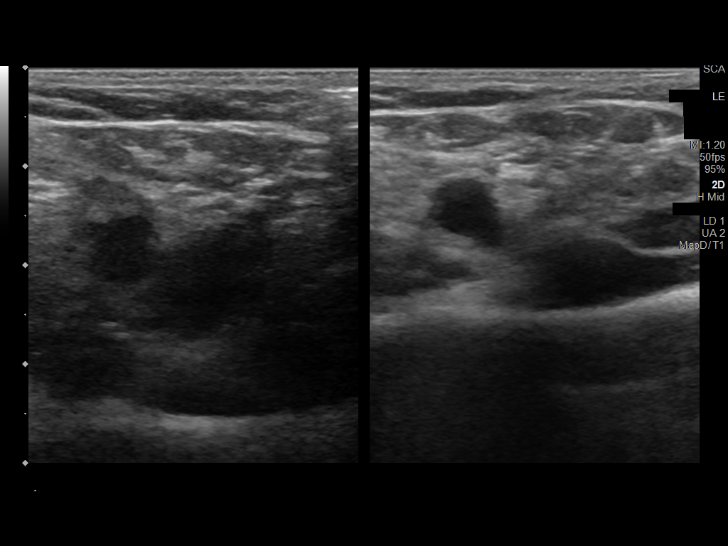
[im 11/51]
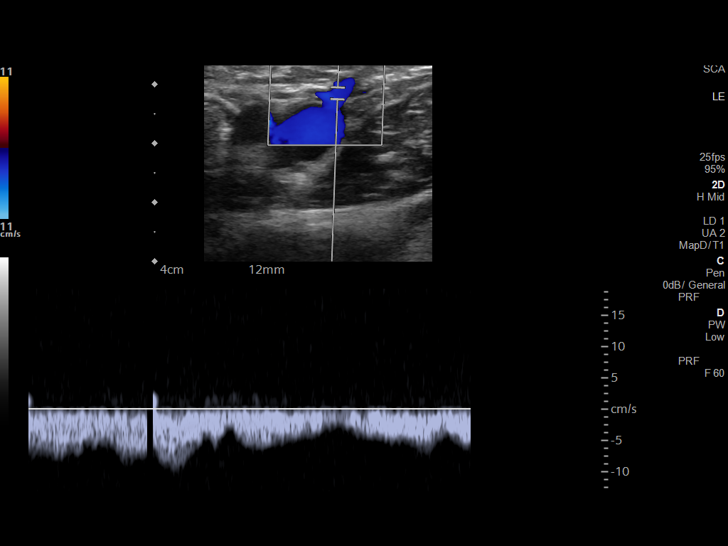
[im 16/51]
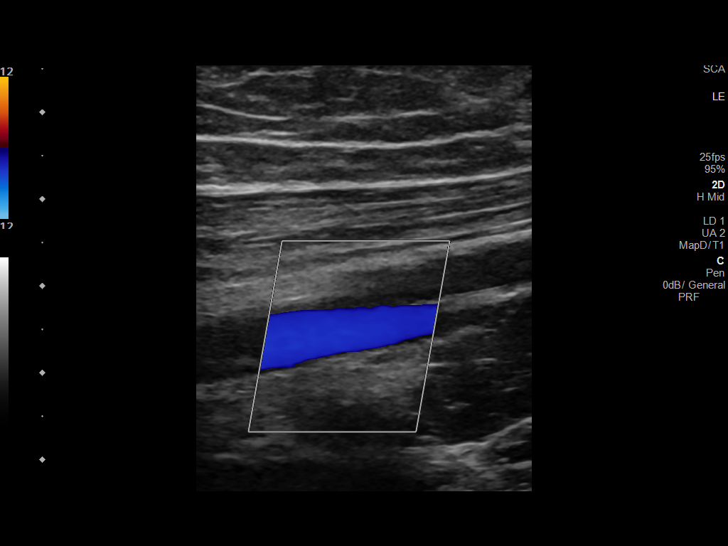
[im 21/51]
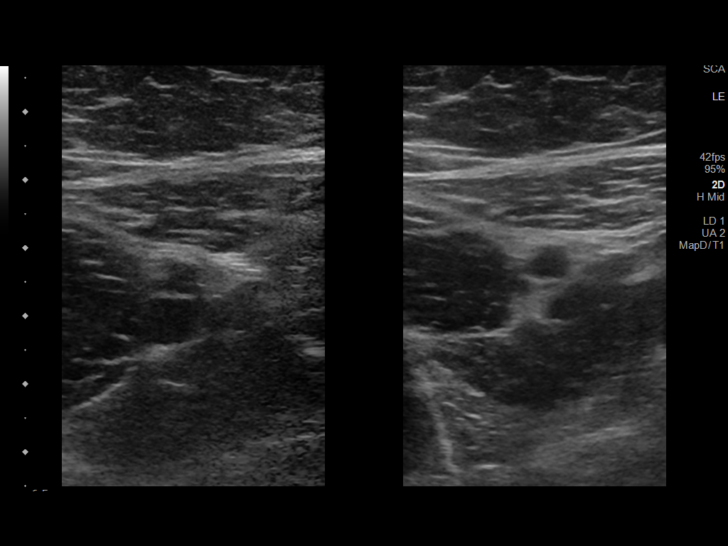
[im 26/51]
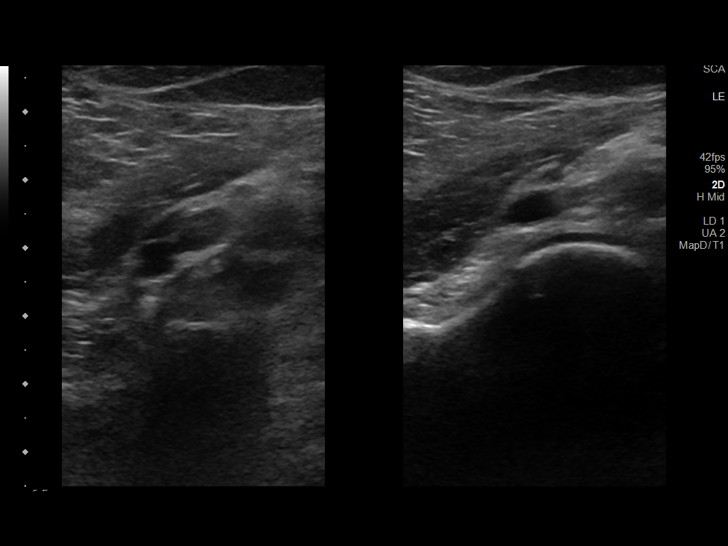
[im 31/51]
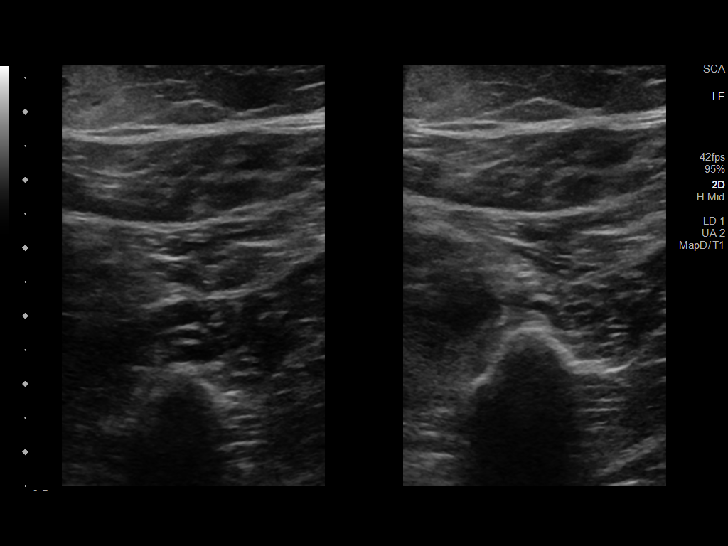
[im 33/51]
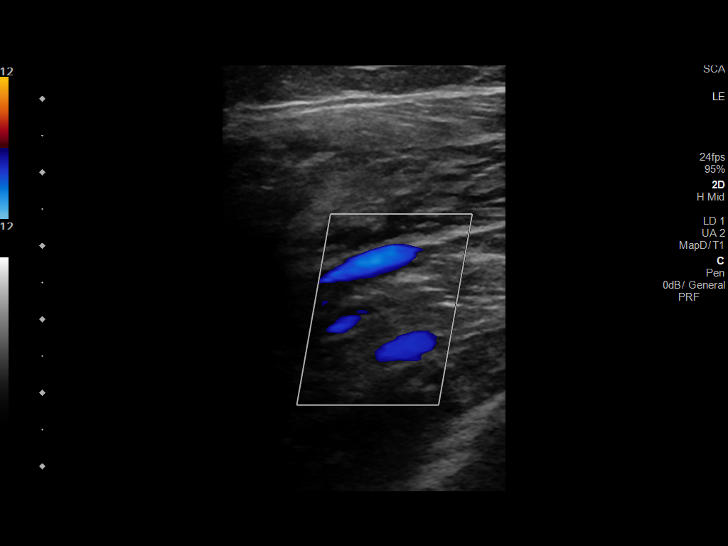
[im 38/51]
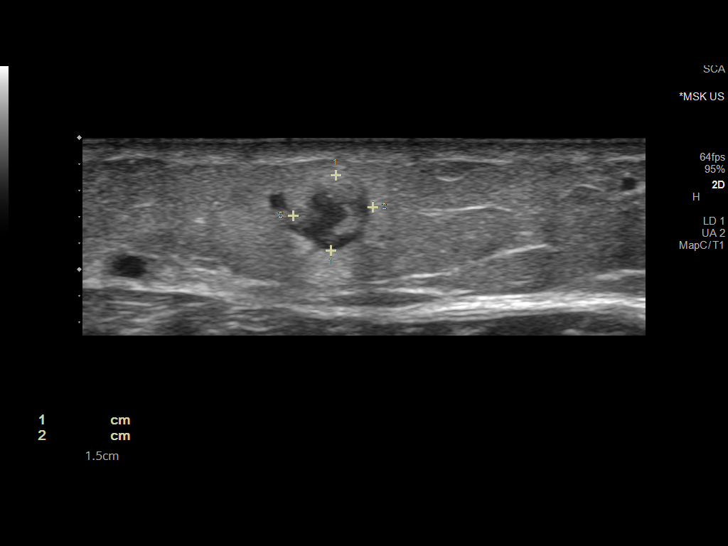
[im 43/51]
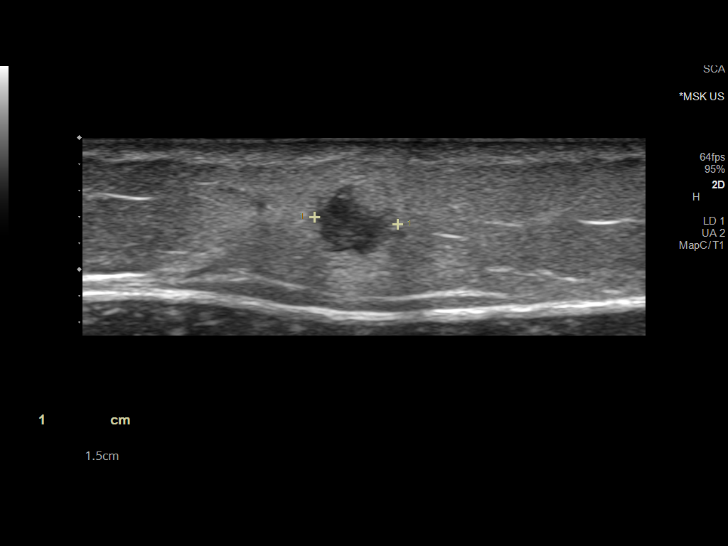
[im 48/51]
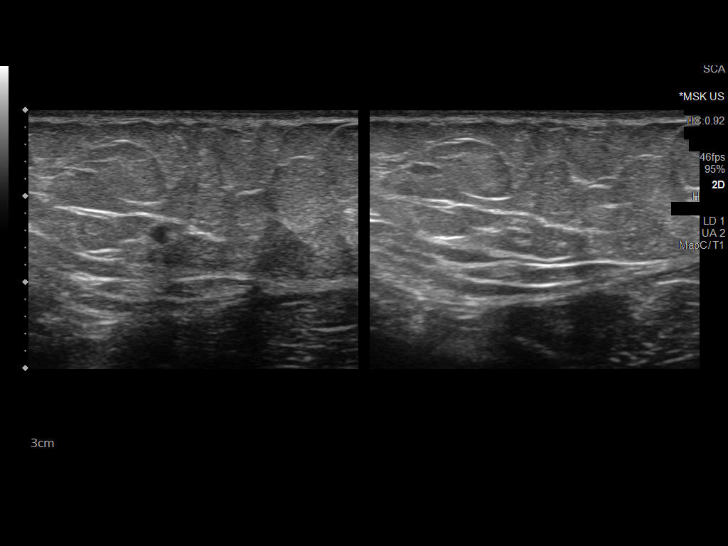

[Series 2: us extrem low venous*right* · 2 of 6 slices shown (2 of 2)]
[im 1/6]
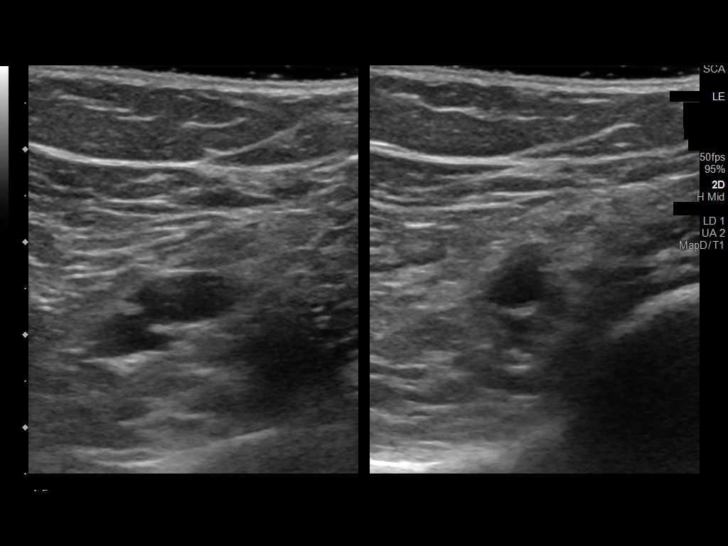
[im 6/6]
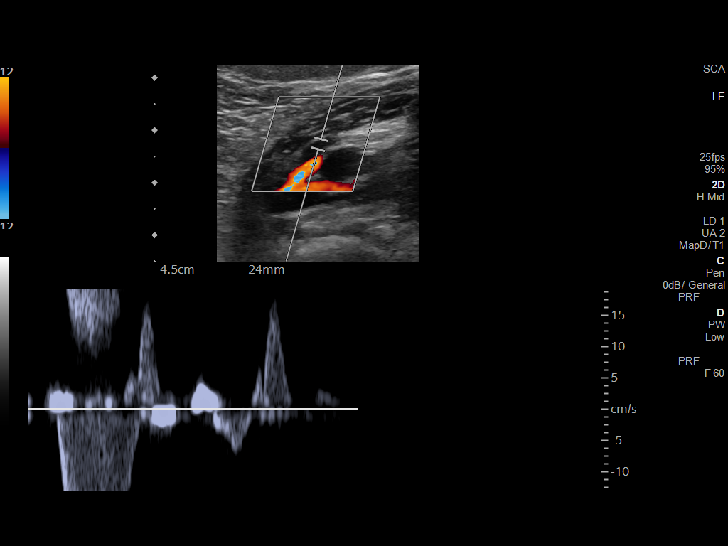

[13 of 24 positions shown; findings below may reference images not displayed]

FINDINGS: Contralateral Common Femoral Vein: Respiratory phasicity is normal
and symmetric with the symptomatic side. No evidence of thrombus.
Normal compressibility.

Common Femoral Vein: No evidence of thrombus. Normal
compressibility, respiratory phasicity and response to augmentation.

Saphenofemoral Junction: No evidence of thrombus. Normal
compressibility and flow on color Doppler imaging.

Profunda Femoral Vein: No evidence of thrombus. Normal
compressibility and flow on color Doppler imaging.

Femoral Vein: No evidence of thrombus. Normal compressibility,
respiratory phasicity and response to augmentation.

Popliteal Vein: No evidence of thrombus. Normal compressibility,
respiratory phasicity and response to augmentation.

Calf Veins: Visualized right deep calf veins are patent without
thrombus.

Superficial Great Saphenous Vein: No evidence of thrombus. Normal
compressibility.

Other Findings: Poorly defined hypoechoic structure at the area of
concern just below the skin in the right calf. This area measures
0.6 x 0.5 x 0.6 cm. This structure is indeterminate. No significant
vascular flow in this structure. Findings are not compatible with a
simple cyst.
IMPRESSION: 1.  Negative for deep venous thrombosis in right lower extremity.
2. Palpable abnormality in the right calf represents a superficial
0.6 cm structure. This structure is poorly defined and
indeterminate. Findings could be related to a small hematoma.
Consider clinical surveillance of this area to see if this structure
resolves.

## 2022-10-18 ENCOUNTER — Encounter: Payer: Self-pay | Admitting: Cardiology

## 2022-10-18 ENCOUNTER — Ambulatory Visit: Payer: Medicaid Other | Admitting: Cardiology

## 2022-11-22 ENCOUNTER — Ambulatory Visit: Payer: Medicaid Other | Admitting: Cardiology

## 2023-04-24 IMAGING — US US OB TRANSVAGINAL
1 series · 15 of 28 positions shown · non-contrast
Comparison: Ultrasound pelvis 02/25/2021

CLINICAL DATA: Vaginal bleeding. Last menstrual period 01/08/2021.
Estimated due date 10/15/2021. gestational age by ultrasound on
02/25/2021: 6 weeks. Previous C-section.

EXAM:
OBSTETRIC <14 WK US AND TRANSVAGINAL OB US
TECHNIQUE: Both transabdominal and transvaginal ultrasound examinations were
performed for complete evaluation of the gestation as well as the
maternal uterus, adnexal regions, and pelvic cul-de-sac.
Transvaginal technique was performed to assess early pregnancy.

[Series 1: us ob transvaginal · 39 acquisitions, 15 frames shown]
[im 1/39]
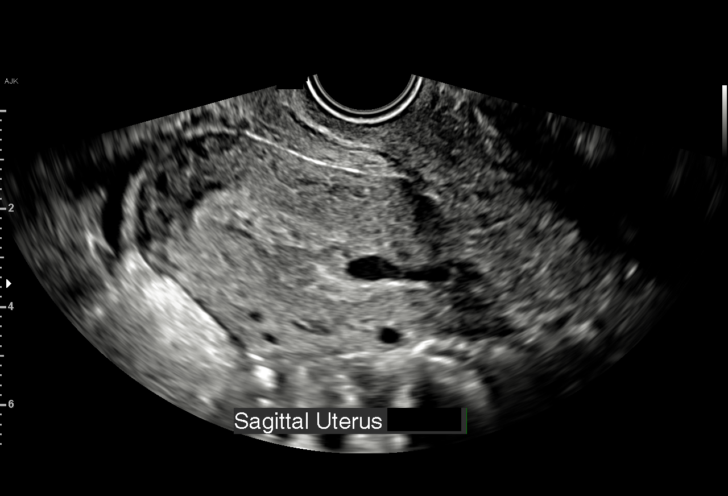
[im 3/39]
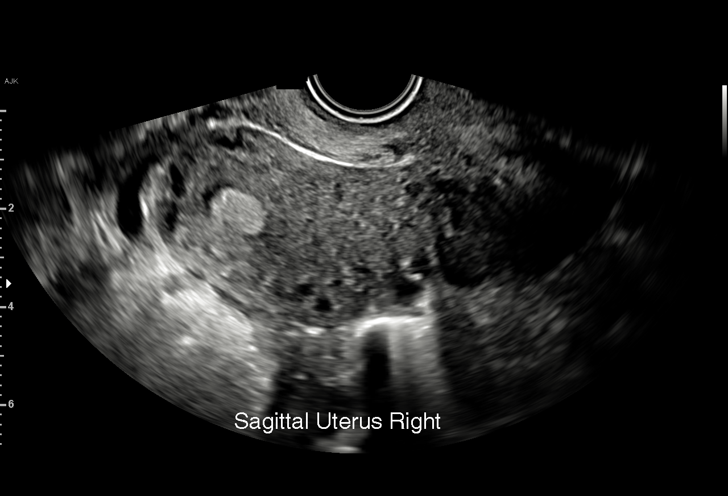
[im 6/39]
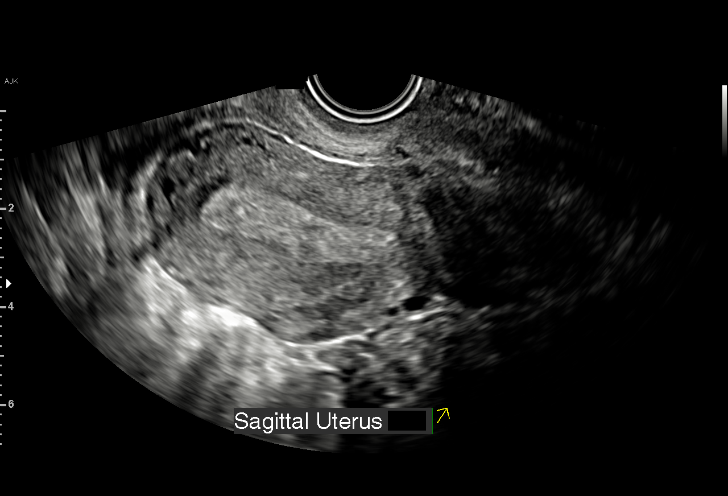
[im 9/39]
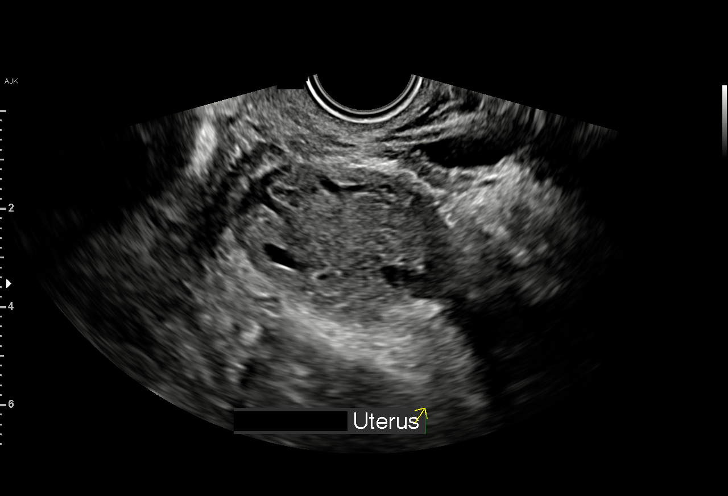
[im 12/39]
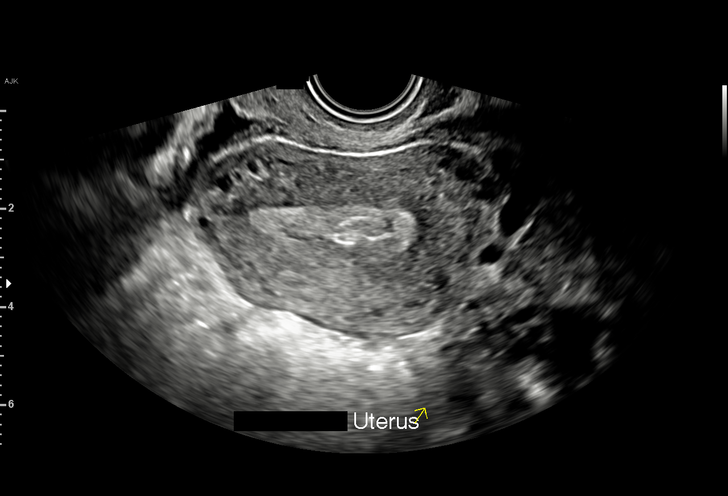
[im 15/39]
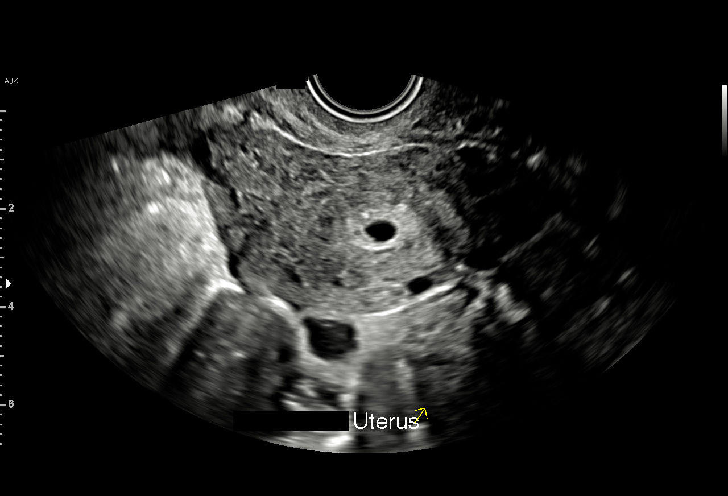
[im 17/39]
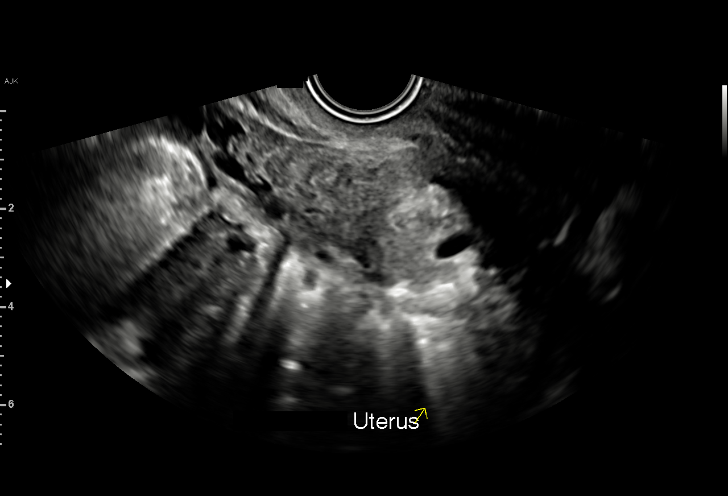
[im 20/39]
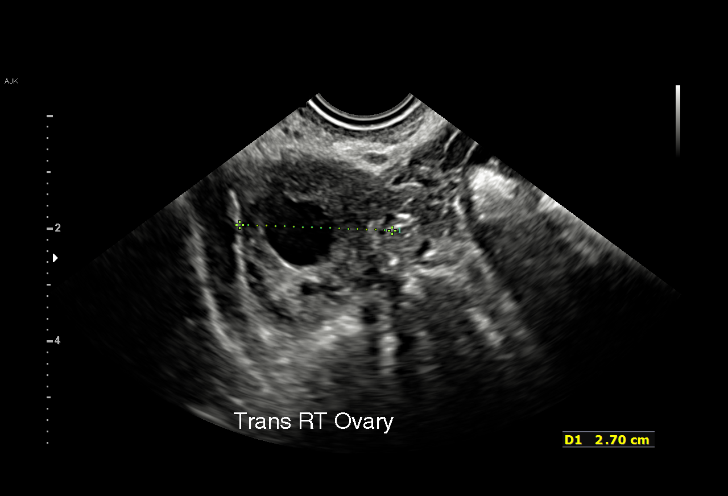
[im 22/39]
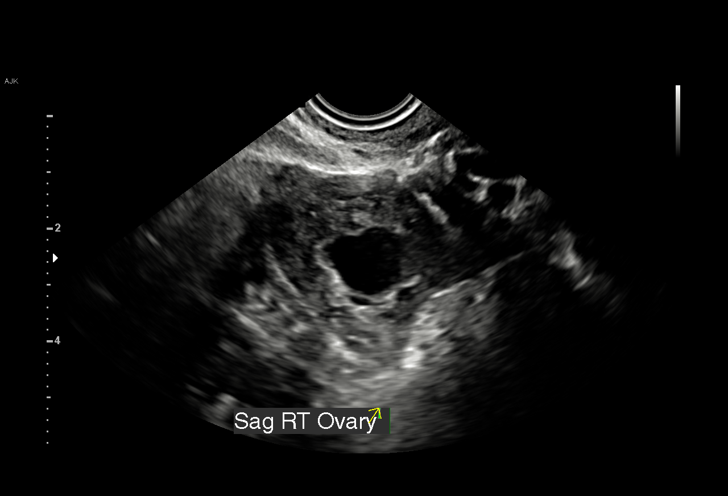
[im 24/39]
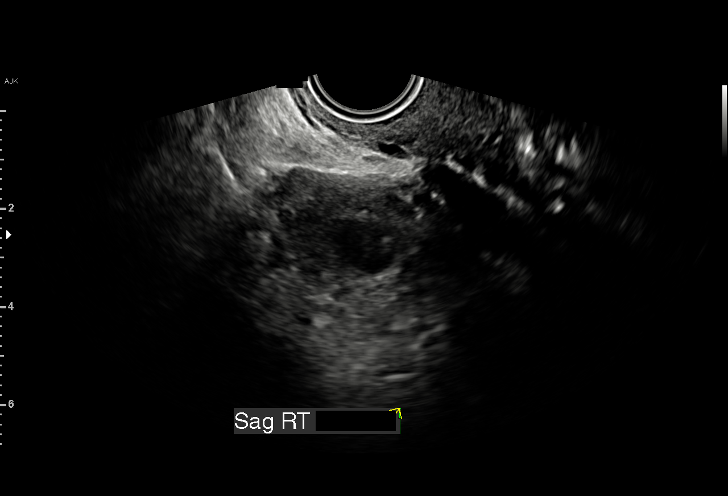
[im 27/39]
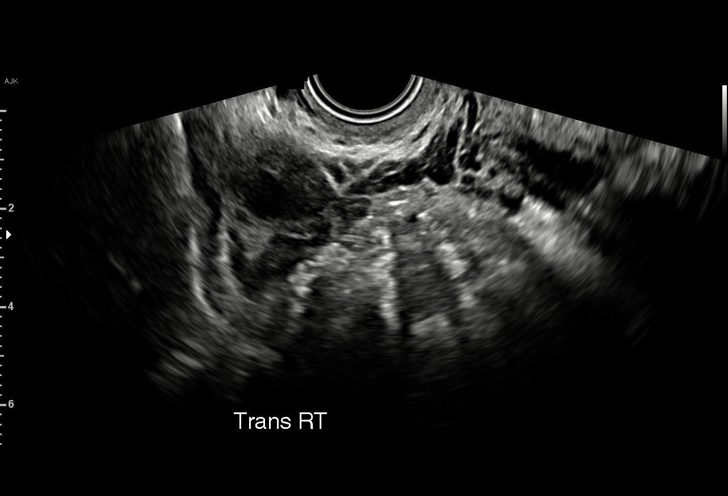
[im 30/39]
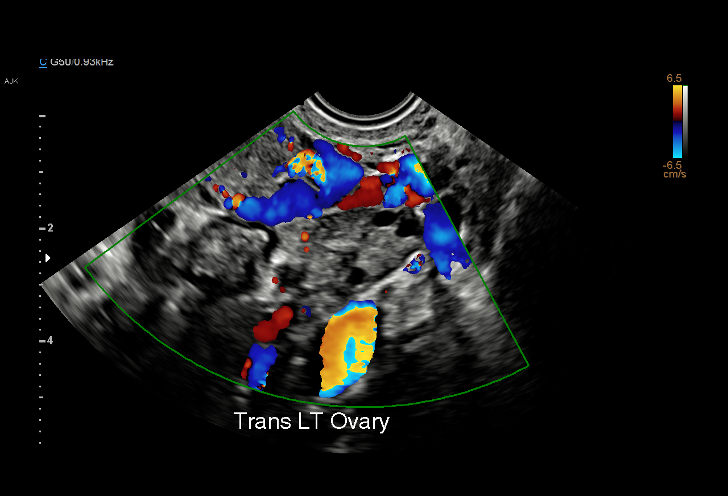
[im 33/39]
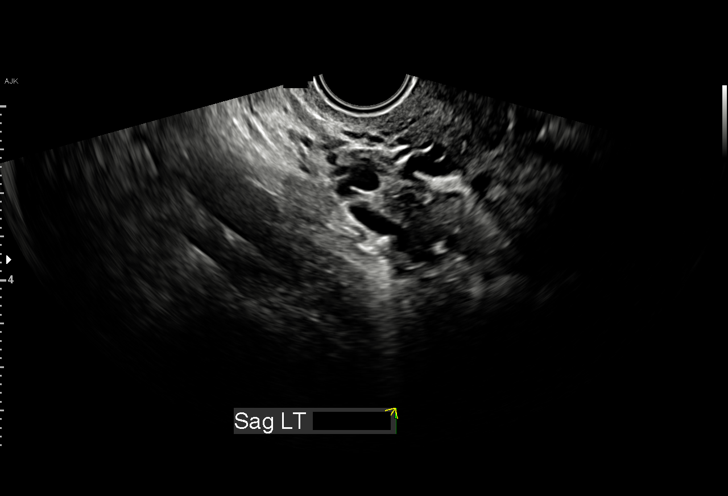
[im 36/39]
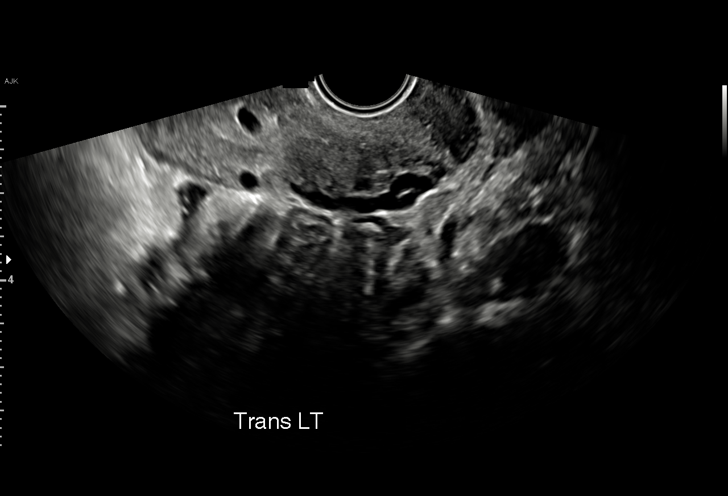
[im 39/39]
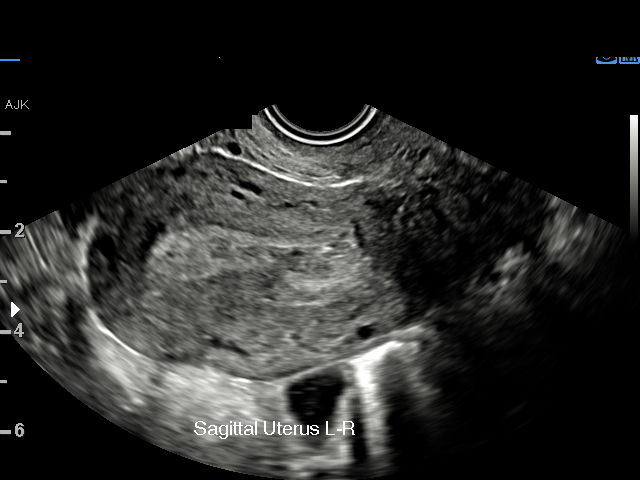

[15 of 28 positions shown; findings below may reference images not displayed]

FINDINGS: Intrauterine gestational sac: Single

Yolk sac:  Not Visualized.

Embryo:  Not Visualized.

Cardiac Activity: Not Visualized.

MSD: 9.9 mm   5 w   5

Subchorionic hemorrhage:  None visualized.

Maternal uterus/adnexae: Bilateral ovaries are unremarkable.
Ultrasound is otherwise unremarkable.

Other: No free pelvic fluid.
IMPRESSION: Probable early intrauterine gestational sac, but no yolk sac, fetal
pole, or cardiac activity yet visualized. Recommend follow-up
quantitative B-HCG levels and follow-up US in 14 days to assess
viability. This recommendation follows SRU consensus guidelines:
Diagnostic Criteria for Nonviable Pregnancy Early in the First
Trimester. N Engl J Med 9915; [DATE].
